# Patient Record
Sex: Male | Born: 1955 | Race: White | Hispanic: No | Marital: Married | Smoking: Current every day smoker
Health system: Southern US, Community
[De-identification: ages and names within clinical notes are randomized; demographics above are authoritative.]

## PROBLEM LIST (undated history)

## (undated) DIAGNOSIS — I10 Essential (primary) hypertension: Secondary | ICD-10-CM

## (undated) DIAGNOSIS — E785 Hyperlipidemia, unspecified: Secondary | ICD-10-CM

## (undated) DIAGNOSIS — K5790 Diverticulosis of intestine, part unspecified, without perforation or abscess without bleeding: Secondary | ICD-10-CM

## (undated) DIAGNOSIS — Z7902 Long term (current) use of antithrombotics/antiplatelets: Secondary | ICD-10-CM

## (undated) DIAGNOSIS — I739 Peripheral vascular disease, unspecified: Secondary | ICD-10-CM

## (undated) DIAGNOSIS — M1990 Unspecified osteoarthritis, unspecified site: Secondary | ICD-10-CM

## (undated) DIAGNOSIS — I700 Atherosclerosis of aorta: Secondary | ICD-10-CM

## (undated) DIAGNOSIS — I2510 Atherosclerotic heart disease of native coronary artery without angina pectoris: Secondary | ICD-10-CM

## (undated) DIAGNOSIS — Z8719 Personal history of other diseases of the digestive system: Secondary | ICD-10-CM

## (undated) HISTORY — PX: CARDIAC SURGERY: SHX584

## (undated) HISTORY — PX: HIATAL HERNIA REPAIR: SHX195

## (undated) HISTORY — PX: HERNIA REPAIR: SHX51

## (undated) HISTORY — PX: CORONARY ARTERY BYPASS GRAFT: SHX141

---

## 2007-02-16 ENCOUNTER — Emergency Department: Payer: Self-pay | Admitting: Emergency Medicine

## 2008-12-21 ENCOUNTER — Inpatient Hospital Stay: Payer: Self-pay | Admitting: Psychiatry

## 2010-07-13 ENCOUNTER — Emergency Department: Payer: Self-pay | Admitting: Emergency Medicine

## 2011-09-01 ENCOUNTER — Inpatient Hospital Stay: Payer: Self-pay | Admitting: Internal Medicine

## 2011-09-01 DIAGNOSIS — I214 Non-ST elevation (NSTEMI) myocardial infarction: Secondary | ICD-10-CM

## 2011-09-01 HISTORY — DX: Non-ST elevation (NSTEMI) myocardial infarction: I21.4

## 2011-09-04 DIAGNOSIS — I214 Non-ST elevation (NSTEMI) myocardial infarction: Secondary | ICD-10-CM

## 2011-09-04 HISTORY — DX: Non-ST elevation (NSTEMI) myocardial infarction: I21.4

## 2011-09-04 HISTORY — PX: CARDIAC CATHETERIZATION: SHX172

## 2011-09-07 DIAGNOSIS — Z951 Presence of aortocoronary bypass graft: Secondary | ICD-10-CM

## 2011-09-07 HISTORY — PX: CORONARY ARTERY BYPASS GRAFT: SHX141

## 2011-09-07 HISTORY — DX: Presence of aortocoronary bypass graft: Z95.1

## 2012-08-23 LAB — URINALYSIS, COMPLETE
Hyaline Cast: 1
Ketone: NEGATIVE
Nitrite: NEGATIVE
Ph: 5 (ref 4.5–8.0)
Protein: NEGATIVE
RBC,UR: <1 /HPF (ref 0–5)
WBC UR: 1 /HPF (ref 0–5)

## 2012-08-23 LAB — COMPREHENSIVE METABOLIC PANEL
Albumin: 3.8 g/dL (ref 3.4–5.0)
Anion Gap: 11 (ref 7–16)
BUN: 7 mg/dL (ref 7–18)
Bilirubin,Total: 0.2 mg/dL (ref 0.2–1.0)
Calcium, Total: 9.2 mg/dL (ref 8.5–10.1)
Creatinine: 1 mg/dL (ref 0.60–1.30)
Glucose: 148 mg/dL — ABNORMAL HIGH (ref 65–99)
Osmolality: 280 (ref 275–301)
Potassium: 4.3 mmol/L (ref 3.5–5.1)
SGOT(AST): 31 U/L (ref 15–37)
SGPT (ALT): 32 U/L (ref 12–78)
Total Protein: 8.2 g/dL (ref 6.4–8.2)

## 2012-08-23 LAB — CBC
MCH: 32.8 pg (ref 26.0–34.0)
MCHC: 34 g/dL (ref 32.0–36.0)
MCV: 96 fL (ref 80–100)
Platelet: 290 10*3/uL (ref 150–440)
RBC: 5.97 10*6/uL — ABNORMAL HIGH (ref 4.40–5.90)
WBC: 9.1 10*3/uL (ref 3.8–10.6)

## 2012-08-23 LAB — DRUG SCREEN, URINE
Amphetamines, Ur Screen: NEGATIVE (ref ?–1000)
Barbiturates, Ur Screen: NEGATIVE (ref ?–200)
Cocaine Metabolite,Ur ~~LOC~~: NEGATIVE (ref ?–300)
MDMA (Ecstasy)Ur Screen: NEGATIVE (ref ?–500)
Methadone, Ur Screen: NEGATIVE (ref ?–300)
Opiate, Ur Screen: NEGATIVE (ref ?–300)
Tricyclic, Ur Screen: NEGATIVE (ref ?–1000)

## 2012-08-24 ENCOUNTER — Inpatient Hospital Stay: Payer: Self-pay | Admitting: Psychiatry

## 2012-10-03 ENCOUNTER — Ambulatory Visit: Payer: Self-pay | Admitting: Unknown Physician Specialty

## 2012-10-04 ENCOUNTER — Ambulatory Visit: Payer: Self-pay | Admitting: Unknown Physician Specialty

## 2013-07-12 ENCOUNTER — Emergency Department: Payer: Self-pay | Admitting: Emergency Medicine

## 2013-07-12 LAB — DRUG SCREEN, URINE
Amphetamines, Ur Screen: NEGATIVE (ref ?–1000)
Barbiturates, Ur Screen: NEGATIVE (ref ?–200)
Methadone, Ur Screen: NEGATIVE (ref ?–300)

## 2013-07-12 LAB — CBC
HCT: 49.8 % (ref 40.0–52.0)
MCH: 31 pg (ref 26.0–34.0)
MCV: 89 fL (ref 80–100)
Platelet: 348 10*3/uL (ref 150–440)
RBC: 5.59 10*6/uL (ref 4.40–5.90)
WBC: 11 10*3/uL — ABNORMAL HIGH (ref 3.8–10.6)

## 2013-07-12 LAB — COMPREHENSIVE METABOLIC PANEL
Anion Gap: 10 (ref 7–16)
BUN: 13 mg/dL (ref 7–18)
Chloride: 105 mmol/L (ref 98–107)
Co2: 20 mmol/L — ABNORMAL LOW (ref 21–32)
EGFR (African American): >60
Osmolality: 269 (ref 275–301)
Potassium: 4 mmol/L (ref 3.5–5.1)
SGPT (ALT): 11 U/L — ABNORMAL LOW (ref 12–78)
Sodium: 135 mmol/L — ABNORMAL LOW (ref 136–145)
Total Protein: 7.6 g/dL (ref 6.4–8.2)

## 2013-07-12 LAB — ACETAMINOPHEN LEVEL: Acetaminophen: <2 ug/mL

## 2013-07-12 LAB — URINALYSIS, COMPLETE
Bacteria: NONE SEEN
Bilirubin,UR: NEGATIVE
Glucose,UR: NEGATIVE mg/dL (ref 0–75)
Hyaline Cast: 3
Ketone: NEGATIVE
Leukocyte Esterase: NEGATIVE
Ph: 5 (ref 4.5–8.0)
Specific Gravity: 1.005 (ref 1.003–1.030)
Squamous Epithelial: NONE SEEN
WBC UR: <1 /HPF (ref 0–5)

## 2013-07-12 LAB — ETHANOL
Ethanol %: <0.003 % (ref 0.000–0.080)
Ethanol: <3 mg/dL

## 2013-07-12 LAB — TROPONIN I: Troponin-I: <0.02 ng/mL
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
## 2016-01-03 ENCOUNTER — Encounter: Payer: Self-pay | Admitting: Emergency Medicine

## 2016-01-03 ENCOUNTER — Emergency Department
Admission: EM | Admit: 2016-01-03 | Discharge: 2016-01-03 | Disposition: A | Discharge status: Home / Self Care | Payer: Self-pay | Attending: Emergency Medicine | Admitting: Emergency Medicine

## 2016-01-03 DIAGNOSIS — J0100 Acute maxillary sinusitis, unspecified: Secondary | ICD-10-CM | POA: Insufficient documentation

## 2016-01-03 DIAGNOSIS — F17210 Nicotine dependence, cigarettes, uncomplicated: Secondary | ICD-10-CM | POA: Insufficient documentation

## 2016-01-03 MED ORDER — BENZONATATE 100 MG PO CAPS: 100.0000 mg | ORAL_CAPSULE | Freq: Three times a day (TID) | ORAL | Status: DC | PRN

## 2016-01-03 MED ORDER — FLUTICASONE PROPIONATE 50 MCG/ACT NA SUSP: 1.0000 | Freq: Every day | NASAL | Status: DC

## 2016-01-03 MED ORDER — AMOXICILLIN 500 MG PO CAPS: 500.0000 mg | ORAL_CAPSULE | Freq: Three times a day (TID) | ORAL | Status: DC

## 2016-01-03 MED ORDER — ONDANSETRON HCL 4 MG PO TABS: 4.0000 mg | ORAL_TABLET | Freq: Four times a day (QID) | ORAL | Status: DC | PRN

## 2016-01-03 NOTE — Discharge Instructions (Signed)

## 2016-01-03 NOTE — ED Notes (Signed)

## 2016-01-03 NOTE — ED Notes (Signed)
Pt here with c/o sinus pressure/pain since Thursday, states it makes him nauseated and dizzy at times, has been trying sudafed with no relief.

## 2016-01-03 NOTE — ED Notes (Signed)
Having sinus pressure and congestion for about 1 week  Also had some dizziness  Unsure of fever

## 2016-01-05 NOTE — ED Provider Notes (Signed)
[facility] Emergency Department Provider Note ____________________________________________  Time seen: 1956  I have reviewed the triage vital signs and the nursing notes.  HISTORY  Chief Complaint  Facial Pain; Nasal Congestion; and Nausea  HPI George Santos is a 60 y.o. male presents to the ED for evaluation of facial sinus pain and nasal congestion since Thursday. The patient also notes some blood-tinged mucus and is also noticed someassociated dizziness and nausea. He's noted some decreased appetite over the last 2 days as well as some dyspepsia. He denies any chest pain, shortness of breath, or productive cough. He does note that his cough has preceded his sinus congestion and drainage. He's been taking Sudafed, Robitussin, and Benadryl without significant benefit to his symptoms. He denies any outright fevers, chills, or sweats.He describes malaise and doses over her discomfort 6/10 in triage.  History reviewed. No pertinent past medical history.  There are no active problems to display for this patient.   Past Surgical History  Procedure Laterality Date  . Cardiac surgery    . Hernia repair      Current Outpatient Rx  Name  Route  Sig  Dispense  Refill  . amoxicillin (AMOXIL) 500 MG capsule   Oral   Take 1 capsule (500 mg total) by mouth 3 (three) times daily.   30 capsule   0   . benzonatate (TESSALON PERLES) 100 MG capsule   Oral   Take 1 capsule (100 mg total) by mouth 3 (three) times daily as needed for cough (Take 1-2 per dose).   30 capsule   0   . fluticasone (FLONASE) 50 MCG/ACT nasal spray   Each Nare   Place 1 spray into both nostrils daily.   16 g   0   . ondansetron (ZOFRAN) 4 MG tablet   Oral   Take 1 tablet (4 mg total) by mouth every 6 (six) hours as needed for nausea or vomiting.   15 tablet   0    Allergies Review of patient's allergies indicates no known allergies.  No family history on file.  Social  History Social History  Substance Use Topics  . Smoking status: Current Every Day Smoker -- 0.50 packs/day    Types: Cigarettes  . Smokeless tobacco: None  . Alcohol Use: No   Review of Systems  Constitutional: Negative for fever. Eyes: Negative for visual changes. ENT: Negative for sore throat. Sinus pressure and drainage as above. Cardiovascular: Negative for chest pain. Respiratory: Negative for shortness of breath. Nonproductive cough as noted. Gastrointestinal: Negative for abdominal pain, vomiting and diarrhea. Genitourinary: Negative for dysuria. Musculoskeletal: Negative for back pain. Skin: Negative for rash. Neurological: Negative for headaches, focal weakness or numbness. ____________________________________________  PHYSICAL EXAM:  VITAL SIGNS: ED Triage Vitals  Enc Vitals Group     BP 01/03/16 1849 129/80 mmHg     Pulse Rate 01/03/16 1849 62     Resp 01/03/16 1849 18     Temp 01/03/16 1849 99.1 F (37.3 C)     Temp Source 01/03/16 1849 Oral     SpO2 01/03/16 1849 97 %     Weight 01/03/16 1846 150 lb (68.04 kg)     Height 01/03/16 1846 5\' 9"  (1.753 m)     Head Cir --      Peak Flow --      Pain Score 01/03/16 1846 6     Pain Loc --      Pain Edu? --  Excl. in GC? --    Constitutional: Alert and oriented. Well appearing and in no distress. Head: Normocephalic and atraumatic. Frontal sinus pressure noted.       Eyes: Conjunctivae are normal. PERRL. Normal extraocular movements      Ears: Canals clear. TMs intact bilaterally.   Nose: No congestion/rhinorrhea. Erythematous nasal mucosa. Large, left nasal polyp noted.    Mouth/Throat: Mucous membranes are moist.   Neck: Supple. No thyromegaly. Hematological/Lymphatic/Immunological: No cervical lymphadenopathy. Cardiovascular: Normal rate, regular rhythm.  Respiratory: Normal respiratory effort. No wheezes/rales/rhonchi. Gastrointestinal: Soft and nontender. No distention. Musculoskeletal:  Nontender with normal range of motion in all extremities.  Neurologic:  Normal gait without ataxia. Normal speech and language. No gross focal neurologic deficits are appreciated. Skin:  Skin is warm, dry and intact. No rash noted. Psychiatric: Mood and affect are normal. Patient exhibits appropriate insight and judgment. ____________________________________________  INITIAL IMPRESSION / ASSESSMENT AND PLAN / ED COURSE  Patient with symptoms that may represent an acute sinusitis and rhinitis. He'll be discharged with prescription for amoxicillin, Flonase, Tessalon Perles, and Zofran to dose as instructed for symptom relief. He is encouraged to increase fluid intake and consider using a room humidifier for symptoms. He is to continue dosing his over-the-counter pseudoephedrine, Benadryl, and Robitussin for symptom relief. He'll follow with his primary care provider for ongoing symptom management. Options are reviewed. ____________________________________________  FINAL CLINICAL IMPRESSION(S) / ED DIAGNOSES  Final diagnoses:  Acute maxillary sinusitis, recurrence not specified      Lissa Hoard, PA-C 01/05/16 0047  Sharyn Creamer, MD 01/08/16 [phone]

## 2017-11-08 ENCOUNTER — Emergency Department
Admission: EM | Admit: 2017-11-08 | Discharge: 2017-11-08 | Disposition: A | Discharge status: Home / Self Care | Payer: Self-pay | Attending: Emergency Medicine | Admitting: Emergency Medicine

## 2017-11-08 ENCOUNTER — Other Ambulatory Visit: Payer: Self-pay

## 2017-11-08 ENCOUNTER — Encounter: Payer: Self-pay | Admitting: Emergency Medicine

## 2017-11-08 DIAGNOSIS — F17210 Nicotine dependence, cigarettes, uncomplicated: Secondary | ICD-10-CM | POA: Insufficient documentation

## 2017-11-08 DIAGNOSIS — M069 Rheumatoid arthritis, unspecified: Secondary | ICD-10-CM | POA: Insufficient documentation

## 2017-11-08 HISTORY — DX: Unspecified osteoarthritis, unspecified site: M19.90

## 2017-11-08 MED ORDER — MELOXICAM 15 MG PO TABS
15.0000 mg | ORAL_TABLET | Freq: Every day | ORAL | 2 refills | Status: DC
Start: 2017-11-08 — End: 2022-04-19

## 2017-11-08 MED ORDER — PREDNISONE 10 MG (21) PO TBPK: ORAL_TABLET | ORAL | 0 refills | Status: DC

## 2017-11-08 NOTE — ED Triage Notes (Signed)
Pt c/o left arm pain and right leg pain for 6 months, states the pain is worsening. Pt states he does a lot of walking and lifting heavy items on a daily basis.   Pt states he leg pain radiates from his hips down to his foot.  Pt ambulatory in triage without difficulty.

## 2017-11-08 NOTE — Discharge Instructions (Signed)
Follow-up with your regular doctor or Dr. Odis Luster if not better in one week, take the medication as prescribed, use a steroid and then when you are done with that medication he may use the anti-inflammatory daily, return to the emergency department appear worsening

## 2017-11-08 NOTE — ED Notes (Signed)
See triage note.having pain to right leg for several months  Ambulates well to treatment   Also having some pain to left arm  No deformity noted

## 2017-11-08 NOTE — ED Provider Notes (Signed)
[facility] Emergency Department Provider Note  ____________________________________________   First MD Initiated Contact with Patient 11/08/17 1051     (approximate)  I have reviewed the triage vital signs and the nursing notes.   HISTORY  Chief Complaint Arm Pain and Leg Pain    HPI George Santos is a 61 y.o. male complains of several joints hurting, states he has "inflammatory arthritis ", he was diagnosed over 20 years ago, he states his left shoulder and right leg has been hurting more since it got cold, he has trouble when he is walking and standing, he has been out of work for several days and will need a work note, denies chest pain or shortness of breath, is not followed regularly for his inflammatory arthritis  Past Medical History:  Diagnosis Date  . Arthritis     There are no active problems to display for this patient.   Past Surgical History:  Procedure Laterality Date  . CARDIAC SURGERY    . HERNIA REPAIR      Prior to Admission medications   Medication Sig Start Date End Date Taking? Authorizing Provider  meloxicam (MOBIC) 15 MG tablet Take 1 tablet (15 mg total) by mouth daily. 11/08/17   Faythe Ghee, PA  predniSONE (STERAPRED UNI-PAK 21 TAB) 10 MG (21) TBPK tablet Take 6 pills on day one then decrease by 1 pill each day 11/08/17   Faythe Ghee, PA    Allergies Patient has no known allergies.  History reviewed. No pertinent family history.  Social History Social History   Tobacco Use  . Smoking status: Current Every Day Smoker    Packs/day: 0.50    Types: Cigarettes  . Smokeless tobacco: Never Used  Substance Use Topics  . Alcohol use: No  . Drug use: Not on file    Review of Systems  Constitutional: No fever/chills Eyes: No visual changes. ENT: No sore throat. Respiratory: Denies cough Genitourinary: Negative for dysuria. Musculoskeletal: Negative for back pain.  Positive for left shoulder pain and right  leg pain that is Skin: Negative for rash.    ____________________________________________   PHYSICAL EXAM:  VITAL SIGNS: ED Triage Vitals  Enc Vitals Group     BP 11/08/17 1041 (!) 115/91     Pulse Rate 11/08/17 1041 66     Resp 11/08/17 1041 18     Temp 11/08/17 1041 97.7 F (36.5 C)     Temp Source 11/08/17 1041 Oral     SpO2 11/08/17 1041 99 %     Weight 11/08/17 1041 150 lb (68 kg)     Height 11/08/17 1041 5\' 9"  (1.753 m)     Head Circumference --      Peak Flow --      Pain Score 11/08/17 1040 7     Pain Loc --      Pain Edu? --      Excl. in GC? --     Constitutional: Alert and oriented. Well appearing and in no acute distress. Eyes: Conjunctivae are normal.  Head: Atraumatic. Nose: No congestion/rhinnorhea. Mouth/Throat: Mucous membranes are moist.   Cardiovascular: Normal rate, regular rhythm.  Heart sounds are normal Respiratory: Normal respiratory effort.  No retractions, lungs are clear to auscultation GU: deferred Musculoskeletal: Patient has decreased range of motion of the left shoulder with overhead reach and internal rotation, pain is reproduced with Hawks sign, there is no bony tenderness on exam, there is no redness or swelling, the right  leg is not tender, his spine is not tender, neurovascular is intact Neurologic:  Normal speech and language.  Skin:  Skin is warm, dry and intact. No rash noted. Psychiatric: Mood and affect are normal. Speech and behavior are normal.  ____________________________________________   LABS (all labs ordered are listed, but only abnormal results are displayed)  Labs Reviewed - No data to display ____________________________________________   ____________________________________________  RADIOLOGY    ____________________________________________   PROCEDURES  Procedure(s) performed: No      ____________________________________________   INITIAL IMPRESSION / ASSESSMENT AND PLAN / ED  COURSE  Pertinent labs & imaging results that were available during my care of the patient were reviewed by me and considered in my medical decision making (see chart for details).  Patient is diagnosed with rheumatoid arthritis flare, he has a long history of "inflammatory arthritis, some of the joints along the hands are swollen and enlarged, proximal joints on the fingers are swollen and enlarged, he has decreased range of motion of the left shoulder;  prescription for Sterapred DS 10 mg six-day Dosepak was given, meloxicam 15 mg daily, patient was instructed to take the anti-inflammatory medication meloxicam after he is finished with the steroid pack, he was told to follow-up with [facility] clinic as that is where he was diagnosed several years ago, he can return to the emergency department if he is worsening, he was given a work note,      ____________________________________________   FINAL CLINICAL IMPRESSION(S) / ED DIAGNOSES  Final diagnoses:  Rheumatoid arthritis flare (HCC)      NEW MEDICATIONS STARTED DURING THIS VISIT:  This SmartLink is deprecated. Use AVSMEDLIST instead to display the medication list for a patient.   Note:  This document was prepared using Dragon voice recognition software and may include unintentional dictation errors.    Faythe Ghee, PA 11/08/17 1112    Jeanmarie Plant, MD 11/08/17 1540

## 2022-01-24 ENCOUNTER — Other Ambulatory Visit: Payer: Self-pay

## 2022-01-24 ENCOUNTER — Emergency Department
Admission: EM | Admit: 2022-01-24 | Discharge: 2022-01-24 | Disposition: A | Discharge status: Home / Self Care | Payer: Medicare Other | Attending: Emergency Medicine | Admitting: Emergency Medicine

## 2022-01-24 DIAGNOSIS — Z7982 Long term (current) use of aspirin: Secondary | ICD-10-CM | POA: Insufficient documentation

## 2022-01-24 DIAGNOSIS — I739 Peripheral vascular disease, unspecified: Secondary | ICD-10-CM | POA: Insufficient documentation

## 2022-01-24 DIAGNOSIS — Z79899 Other long term (current) drug therapy: Secondary | ICD-10-CM | POA: Insufficient documentation

## 2022-01-24 DIAGNOSIS — M79671 Pain in right foot: Secondary | ICD-10-CM | POA: Diagnosis present

## 2022-01-24 DIAGNOSIS — Z951 Presence of aortocoronary bypass graft: Secondary | ICD-10-CM | POA: Insufficient documentation

## 2022-01-24 DIAGNOSIS — I10 Essential (primary) hypertension: Secondary | ICD-10-CM | POA: Diagnosis not present

## 2022-01-24 DIAGNOSIS — G8929 Other chronic pain: Secondary | ICD-10-CM | POA: Diagnosis not present

## 2022-01-24 LAB — BASIC METABOLIC PANEL
Anion gap: 8 (ref 5–15)
BUN: 7 mg/dL — ABNORMAL LOW (ref 8–23)
CO2: 25 mmol/L (ref 22–32)
Calcium: 8.5 mg/dL — ABNORMAL LOW (ref 8.9–10.3)
Chloride: 104 mmol/L (ref 98–111)
Creatinine, Ser: 0.79 mg/dL (ref 0.61–1.24)
GFR, Estimated: >60 mL/min (ref >60)
Glucose, Bld: 97 mg/dL (ref 70–99)
Potassium: 3.8 mmol/L (ref 3.5–5.1)
Sodium: 137 mmol/L (ref 135–145)

## 2022-01-24 LAB — LACTIC ACID, PLASMA
Lactic Acid, Venous: 2.3 mmol/L (ref 0.5–1.9)
Lactic Acid, Venous: 1.4 mmol/L (ref 0.5–1.9)

## 2022-01-24 LAB — CBC
HCT: 51.5 % (ref 39.0–52.0)
Hemoglobin: 16.3 g/dL (ref 13.0–17.0)
MCH: 28.5 pg (ref 26.0–34.0)
MCHC: 31.7 g/dL (ref 30.0–36.0)
MCV: 90.2 fL (ref 80.0–100.0)
Platelets: 349 10*3/uL (ref 150–400)
RBC: 5.71 MIL/uL (ref 4.22–5.81)
RDW: 13 % (ref 11.5–15.5)
WBC: 9.7 10*3/uL (ref 4.0–10.5)
nRBC: 0 % (ref 0.0–0.2)

## 2022-01-24 NOTE — ED Notes (Signed)
BP in all 4 extremities as follows:  L arm: 178/75  R arm: 212/78  LLE: 124/97  RLE: 153/93

## 2022-01-24 NOTE — Discharge Instructions (Signed)
Please call the clinic of Dr. Wyn Quaker to set up an appointment to be seen in the clinic to discuss an angiography.  This is a procedure where they squirt dye in your blood vessels of your legs to look for blockages  In the meantime, if you have any severely worsening pain, fevers or pus coming from the wounds of your feet then please return to the ED.

## 2022-01-24 NOTE — ED Provider Notes (Signed)
[facility] Provider Note    Event Date/Time   First MD Initiated Contact with Patient 01/24/22 1147     (approximate)   History   Foot Pain   HPI  [place] Santos is a 66 y.o. male who presents to the ED for evaluation of Foot Pain   No recent medical encounters to review. Patient reports a remote history of CABG, but he has not seen a cardiologist in years.  Only takes aspirin daily, no other medications. Reports a 45+ pack year smoking history.  Patient presents to the ED for evaluation of chronic bilateral foot pain.  He reports months of pain, that he thought would get better but has not so he wanted to get checked out today.  Reports a scab on the lateral aspect of his right foot at the base of his fifth phalanx.  Denies any drainage from this area, reports that it is hard and will not go away.   Physical Exam   Triage Vital Signs: ED Triage Vitals  Enc Vitals Group     BP 01/24/22 1010 (!) 205/75     Pulse Rate 01/24/22 1010 (!) 49     Resp 01/24/22 1010 18     Temp 01/24/22 1010 98 F (36.7 C)     Temp src --      SpO2 01/24/22 1010 98 %     Weight 01/24/22 1146 149 lb 14.6 oz (68 kg)     Height 01/24/22 1146 5\' 9"  (1.753 m)     Head Circumference --      Peak Flow --      Pain Score 01/24/22 1008 6     Pain Loc --      Pain Edu? --      Excl. in GC? --     Most recent vital signs: Vitals:   01/24/22 1010 01/24/22 1238  BP: (!) 205/75 (!) 178/75  Pulse: (!) 49 (!) 46  Resp: 18 20  Temp: 98 F (36.7 C)   SpO2: 98% 96%    General: Awake, no distress.  Ambulatory independently with a normal gait. CV:  Good peripheral perfusion.  Resp:  Normal effort.  Abd:  No distention.  MSK:  No deformity noted.  Neuro:  No focal deficits appreciated. Other:  Erythematous bilateral lower extremities with minimal/trace soft tissue swelling and pitting edema symmetrically.  Capillary refill is quite slow.  DP pulses stronger on the right  than the left, very faint on the left.  Cap refill is slower on the left. Pack of cigarettes in his breast pocket of his T-shirt.      ED Results / Procedures / Treatments   Labs (all labs ordered are listed, but only abnormal results are displayed) Labs Reviewed  LACTIC ACID, PLASMA - Abnormal; Notable for the following components:      Result Value   Lactic Acid, Venous 2.3 (*)    All other components within normal limits  BASIC METABOLIC PANEL - Abnormal; Notable for the following components:   BUN 7 (*)    Calcium 8.5 (*)    All other components within normal limits  LACTIC ACID, PLASMA  CBC    EKG   RADIOLOGY   Official radiology report(s): No results found.  PROCEDURES and INTERVENTIONS:  Procedures  Medications - No data to display   IMPRESSION / MDM / ASSESSMENT AND PLAN / ED COURSE  I reviewed the triage vital signs and the nursing notes.  66 year old male presents to the ED with chronic bilateral foot pain, likely PAD and suitable for outpatient management with vascular surgery follow-up.  He is hypertensive and his only complaints are bilateral foot pain.  He is ambulatory independently with a normal gait.  He has signs of PAD with poor ABIs and lifelong smoking history.  Blood work without leukocytosis to suggest superimposed cellulitis and his metabolic panel is unremarkable.  Lactic acid is initially marginally elevated, and then clears with a cup of water.  No concern for superimposed cellulitis or infection.  Less likely to represent CHF or chronic venous stasis dermatitis, more concerned about PAD.  I considered heparinization and medical admission with the patient, but he does not have any uncontrolled or rest pain at this point and I think outpatient follow-up with vascular is reasonable.  We discussed close return precautions and he is suitable for follow-up with them  Clinical Course as of 01/24/22 1319  Tue Jan 24, 2022  1311 ABI on the left is  0.70 ABI on the right is 0.72 [DS]  1316 Reassessed.  Discussed my concerns for PAD and I recommended he quit smoking.  We discussed close follow-up with vascular surgery.  We discussed appropriate return precautions for the ED in the meantime. [DS]    Clinical Course User Index [DS] Vladimir Crofts, MD     FINAL CLINICAL IMPRESSION(S) / ED DIAGNOSES   Final diagnoses:  PAD (peripheral artery disease) ([place])  Bilateral foot pain     Rx / DC Orders   ED Discharge Orders     None        Note:  This document was prepared using Dragon voice recognition software and may include unintentional dictation errors.   Vladimir Crofts, MD 01/24/22 1323

## 2022-01-24 NOTE — ED Triage Notes (Addendum)
Pt comes with c/o bilateral foot pain. Pt states this started over month ago. Pt states some sore on side of foot. No drainage just pain, redness and some swelling. Pt denies any hx of diabetes.

## 2022-01-24 NOTE — ED Notes (Signed)
First Nurse Note:  Pt to ED via POV, Pt states that he is having pain in both feet. Pt also states that he has wounds on both feet. Pt is in NAD.

## 2022-02-07 ENCOUNTER — Other Ambulatory Visit: Payer: Self-pay

## 2022-02-07 ENCOUNTER — Encounter (INDEPENDENT_AMBULATORY_CARE_PROVIDER_SITE_OTHER): Payer: Self-pay | Admitting: Vascular Surgery

## 2022-02-07 ENCOUNTER — Telehealth (INDEPENDENT_AMBULATORY_CARE_PROVIDER_SITE_OTHER): Payer: Self-pay

## 2022-02-07 ENCOUNTER — Ambulatory Visit (INDEPENDENT_AMBULATORY_CARE_PROVIDER_SITE_OTHER): Payer: Medicare Other | Admitting: Vascular Surgery

## 2022-02-07 DIAGNOSIS — I10 Essential (primary) hypertension: Secondary | ICD-10-CM | POA: Diagnosis not present

## 2022-02-07 DIAGNOSIS — I7025 Atherosclerosis of native arteries of other extremities with ulceration: Secondary | ICD-10-CM

## 2022-02-07 DIAGNOSIS — F17200 Nicotine dependence, unspecified, uncomplicated: Secondary | ICD-10-CM

## 2022-02-07 NOTE — H&P (View-Only) (Signed)
? ? ?Patient ID: [place] Santos, male   DOB: 1956/04/24, 66 y.o.   MRN: [id number] ? ?Chief Complaint  ?Patient presents with  ? Establish Care  ?  Pt referred by [facility] ED for feet  ? ? ?HPI ?[place] Santos is a 66 y.o. male.  I am asked to see the patient by Dr. Tamala Julian in the [facility] emergency department for evaluation of pain and discoloration of both feet with concern for malperfusion.  The right foot has a scab on the lateral aspect near the fifth metatarsal head.  Both feet are very painful with very short distances of walking and become quite purplish and discolored.  The pain wakes him from night.  He dangles his foot on the right and sometimes on the left to help him sleep.  No previous history of intervention or surgery to the lower extremities, but has had coronary bypass. This has been progressively worse over the past 3-4 months.  No clear cause or inciting event. ? ? ?Past Medical History:  ?Diagnosis Date  ? Arthritis   ?Coronary artery disease ?Tobacco abuse ? ?Past Surgical History:  ?Procedure Laterality Date  ? CARDIAC SURGERY    ? HERNIA REPAIR    ? ? ? ?Family History ?No bleeding disorders, clotting disorders, autoimmune diseases, or aneurysms ? ? ?Social History  ? ?Tobacco Use  ? Smoking status: Every Day  ?  Packs/day: 0.50  ?  Types: Cigarettes  ? Smokeless tobacco: Never  ?Vaping Use  ? Vaping Use: Never used  ?Substance Use Topics  ? Alcohol use: No  ? Drug use: Never  ? ? ? ?No Known Allergies ? ?Current Outpatient Medications  ?Medication Sig Dispense Refill  ? aspirin EC 81 MG tablet Take 81 mg by mouth daily. Swallow whole.    ? ibuprofen (ADVIL) 200 MG tablet Take 200 mg by mouth every 6 (six) hours as needed.    ? meloxicam (MOBIC) 15 MG tablet Take 1 tablet (15 mg total) by mouth daily. (Patient not taking: Reported on 02/07/2022) 30 tablet 2  ? ?No current facility-administered medications for this visit.  ? ? ? ? ?REVIEW OF SYSTEMS (Negative unless checked) ? ?Constitutional:  [] Weight loss  [] Fever  [] Chills ?Cardiac: [] Chest pain   [] Chest pressure   [] Palpitations   [] Shortness of breath when laying flat   [] Shortness of breath at rest   [] Shortness of breath with exertion. ?Vascular:  [x] Pain in legs with walking   [x] Pain in legs at rest   [] Pain in legs when laying flat   [] Claudication   [] Pain in feet when walking  [x] Pain in feet at rest  [] Pain in feet when laying flat   [] History of DVT   [] Phlebitis   [] Swelling in legs   [] Varicose veins   [] Non-healing ulcers ?Pulmonary:   [] Uses home oxygen   [] Productive cough   [] Hemoptysis   [] Wheeze  [] COPD   [] Asthma ?Neurologic:  [] Dizziness  [] Blackouts   [] Seizures   [] History of stroke   [] History of TIA  [] Aphasia   [] Temporary blindness   [] Dysphagia   [] Weakness or numbness in arms   [] Weakness or numbness in legs ?Musculoskeletal:  [x] Arthritis   [] Joint swelling   [] Joint pain   [] Low back pain ?Hematologic:  [] Easy bruising  [] Easy bleeding   [] Hypercoagulable state   [] Anemic  [] Hepatitis ?Gastrointestinal:  [] Blood in stool   [] Vomiting blood  [] Gastroesophageal reflux/heartburn   [] Abdominal pain ?Genitourinary:  [] Chronic kidney disease   []   Difficult urination  [] Frequent urination  [] Burning with urination   [] Hematuria ?Skin:  [] Rashes   [] Ulcers   [] Wounds ?Psychological:  [] History of anxiety   []  History of major depression. ? ? ? ?Physical Exam ?BP (!) 219/86 (BP Location: Left Arm)   Pulse (!) 50   Resp 16   Ht 5\' 9"  (1.753 m)   Wt 138 lb 9.6 oz (62.9 kg)   BMI 20.47 kg/m?  ?Gen:  WD/WN, NAD.  Appears older than stated age ?Head: [place]/AT, No temporalis wasting.  ?Ear/Nose/Throat: Hearing grossly intact, nares w/o erythema or drainage, oropharynx w/o Erythema/Exudate ?Eyes: Conjunctiva clear, sclera non-icteric  ?Neck: trachea midline.  No JVD.  ?Pulmonary:  Good air movement, respirations not labored, no use of accessory muscles  ?Cardiac: RRR, no JVD ?Vascular:  ?Vessel Right Left  ?Radial Palpable Palpable  ?     ?    ?    ?    ?    ?    ?DP NP NP  ?PT NP NP  ? ?Gastrointestinal:. No masses, surgical incisions, or scars. ?Musculoskeletal: M/S 5/5 throughout.  Feeder ruborous with somewhat delayed capillary refill.  There is a scab on the lateral aspect of the right fifth metatarsal area.  No gangrenous changes.  No drainage ?Neurologic: Sensation grossly intact in extremities.  Symmetrical.  Speech is fluent. Motor exam as listed above. ?Psychiatric: Judgment intact, Mood & affect appropriate for pt's clinical situation. ?Dermatologic: No rashes or ulcers noted.  No cellulitis or open wounds. ? ? ? ?Radiology ?No results found. ? ?Labs ?Recent Results (from the past 2160 hour(s))  ?Lactic acid, plasma     Status: Abnormal  ? Collection Time: 01/24/22 10:10 AM  ?Result Value Ref Range  ? Lactic Acid, Venous 2.3 (HH) 0.5 - 1.9 mmol/L  ?  Comment: CRITICAL RESULT CALLED TO, READ BACK BY AND VERIFIED WITH ?ANGELA ROBBINS AT 1100 01/24/22 DAS ?Performed at [facility], [address], [place], [place] [address] ?  ?CBC     Status: None  ? Collection Time: 01/24/22 10:10 AM  ?Result Value Ref Range  ? WBC 9.7 4.0 - 10.5 K/uL  ? RBC 5.71 4.22 - 5.81 MIL/uL  ? Hemoglobin 16.3 13.0 - 17.0 g/dL  ? HCT 51.5 39.0 - 52.0 %  ? MCV 90.2 80.0 - 100.0 fL  ? MCH 28.5 26.0 - 34.0 pg  ? MCHC 31.7 30.0 - 36.0 g/dL  ? RDW 13.0 11.5 - 15.5 %  ? Platelets 349 150 - 400 K/uL  ? nRBC 0.0 0.0 - 0.2 %  ?  Comment: Performed at [facility], [address] [place], [place] [address]  ?Lactic acid, plasma     Status: None  ? Collection Time: 01/24/22 12:25 PM  ?Result Value Ref Range  ? Lactic Acid, Venous 1.4 0.5 - 1.9 mmol/L  ?  Comment: Performed at [facility], [address], [place], [place] [address]  ?Basic metabolic panel     Status: Abnormal  ? Collection Time: 01/24/22 12:25 PM  ?Result Value Ref Range  ? Sodium 137 135 - 145 mmol/L  ? Potassium 3.8 3.5 - 5.1 mmol/L  ? Chloride 104 98 - 111 mmol/L  ? CO2 25 22  - 32 mmol/L  ? Glucose, Bld 97 70 - 99 mg/dL  ?  Comment: Glucose reference range applies only to samples taken after fasting for at least 8 hours.  ? BUN 7 (L) 8 - 23 mg/dL  ?  Creatinine, Ser 0.79 0.61 - 1.24 mg/dL  ? Calcium 8.5 (L) 8.9 - 10.3 mg/dL  ? GFR, Estimated >60 >60 mL/min  ?  Comment: (NOTE) ?Calculated using the CKD-EPI Creatinine Equation (2021) ?  ? Anion gap 8 5 - 15  ?  Comment: Performed at [facility], [address], [place], [place] [address]  ? ? ?Assessment/Plan: ? ?Atherosclerosis of native arteries of the extremities with ulceration ([place]) ? Recommend: ? ?The patient has evidence of severe atherosclerotic changes of both lower extremities associated with ulceration and tissue loss of the foot.  This represents a limb threatening ischemia and places the patient at the risk for limb loss. ? ?Patient should undergo angiography of the lower extremities with the hope for intervention for limb salvage.  The risks and benefits as well as the alternative therapies was discussed in detail with the patient.  All questions were answered.  Patient agrees to proceed with angiography.  We will perform the right leg first as there is a wound on the right foot and only pain on the left leg. ? ?The patient will follow up with me in the office after the procedure.  ? ? ?Tobacco use disorder ?We had a discussion for approximately 4 minutes regarding the absolute need for smoking cessation due to the deleterious nature of tobacco on the vascular system. We discussed the tobacco use would diminish patency of any intervention, and likely significantly worsen progressio of disease. We discussed multiple agents for quitting including replacement therapy or medications to reduce cravings such as Chantix. A Rx from Chantix was given today. The patient voices their understanding of the importance of smoking cessation. ? ?Essential hypertension ?Not previously diagnosed, but BP very high today. Does not have  a PCP but needs one. ? ? ? ? ? ?Leotis Pain ?02/07/2022, 10:39 AM ? ? ?This note was created with Dragon medical transcription system.  Any errors from dictation are unintentional.    ?

## 2022-02-07 NOTE — Patient Instructions (Signed)
Peripheral Vascular Disease ?Peripheral vascular disease (PVD) is a disease of the blood vessels that carry blood from the heart to the rest of the body. PVD is also called peripheral artery disease (PAD) or poor circulation. PVD affects most of the body. But it affects the legs and feet the most. ?PVD can lead to acute limb ischemia. This happens when there is a sudden stop of blood flow to an arm or leg. This is a medical emergency. ?What are the causes? ?The most common cause of PVD is a buildup of a fatty substance (plaque) inside your arteries. This decreases blood flow. Plaque can break off and block blood in a smaller artery. This can lead to acute limb ischemia. ?Other common causes of PVD include: ?Blood clots inside the blood vessels. ?Injuries to blood vessels. ?Irritation and swelling of blood vessels. ?Sudden tightening of the blood vessel (spasms). ?What increases the risk? ?A family history of PVD. ?Medical conditions, including: ?High cholesterol. ?Diabetes. ?High blood pressure. ?Heart disease. ?Past problems with blood clots. ?Past injury, such as burns or a broken bone. ?Other conditions, such as: ?Buerger's disease. This is caused by swollen or irritated blood vessels in your hands and feet. ?Arthritis. ?Birth defects that affect the arteries in your legs. ?Kidney disease. ?Using tobacco or nicotine products. ?Not getting enough exercise. ?Being very overweight (obese). ?Being 50 years old or older. ?What are the signs or symptoms? ?Cramps in your butt, legs, and feet. ?Pain and weakness in your legs when you are active that goes away when you rest. ?Leg pain when at rest. ?Leg numbness, tingling, or weakness. ?Coldness in a leg or foot, especially when compared with the other leg or foot. ?Skin or hair changes. These can include: ?Hair loss. ?Shiny skin. ?Pale or bluish skin. ?Thick toenails. ?Being unable to get or keep an erection. ?Tiredness (fatigue). ?Weak pulse or no pulse in the  feet. ?Wounds and sores on the toes, feet, or legs. These take longer to heal. ?How is this treated? ?Underlying causes are treated first. Other conditions, like diabetes, high cholesterol, and blood pressure, are also treated. Treatment may include: ?Lifestyle changes, such as: ?Quitting smoking. ?Getting regular exercise. ?Having a diet low in fat and cholesterol. ?Not drinking alcohol. ?Taking medicines, such as: ?Blood thinners. ?Medicines to improve blood flow. ?Medicines to improve your blood cholesterol. ?Procedures to: ?Open the arteries and restore blood flow. ?Insert a small mesh tube (stent) to keep a blocked vessel open. ?Create a new path for blood to flow to the body (peripheral bypass). ?Remove dead tissue from a wound. ?Remove an affected leg or arm. ?Follow these instructions at home: ?Medicines ?Take over-the-counter and prescription medicines only as told by your doctor. ?If you are taking blood thinners: ?Talk with your doctor before you take any medicines that have aspirin, or NSAIDs, such as ibuprofen. ?Take medicines exactly as told. Take them at the same time each day. ?Avoid doing things that could hurt or bruise you. Take action to prevent falls. ?Wear an alert bracelet or carry a card that shows you are taking blood thinners. ?Lifestyle ?  ?Get regular exercise. Ask your doctor about how to stay active. ?Talk with your doctor about keeping a healthy weight. If needed, ask about losing weight. ?Eat a diet that is low in fat and cholesterol. If you need help, talk with your doctor. ?Do not drink alcohol. ?Do not smoke or use any products that contain nicotine or tobacco. If you need help quitting, ask your   doctor. ?General instructions ?Take good care of your feet. To do this: ?Wear shoes that fit well and feel good. ?Check your feet often for any cuts or sores. ?Get a flu shot (influenza vaccine) each year. ?Keep all follow-up visits. ?Where to find more information ?Society for Vascular  Surgery: vascular.org ?American Heart Association: heart.org ?National Heart, Lung, and Blood Institute: nhlbi.nih.gov ?Contact a doctor if: ?You have cramps in your legs when you walk. ?You have leg pain when you rest. ?Your leg or foot feels cold. ?Your skin changes. ?You cannot get or keep an erection. ?You have cuts or sores on your legs or feet that do not heal. ?Get help right away if: ?You have sudden changes in the color and feeling of your arms or legs, such as: ?Your arm or leg turns cold, numb, and blue. ?Your arm or leg becomes red, warm, swollen, painful, or numb. ?You have any signs of a stroke. "BE FAST" is an easy way to remember the main warning signs: ?B - Balance. Dizziness, sudden trouble walking, or loss of balance. ?E - Eyes. Trouble seeing or a change in how you see. ?F - Face. Sudden weakness or loss of feeling of the face. The face or eyelid may droop on one side. ?A - Arms. Weakness or loss of feeling in an arm. This happens all of a sudden and most often on one side of the body. ?S - Speech. Sudden trouble speaking, slurred speech, or trouble understanding what people say. ?T - Time. Time to call emergency services. Write down what time symptoms started. ?You have other signs of a stroke, such as: ?A sudden, very bad headache with no known cause. ?Feeling like you may vomit (nausea). ?Vomiting. ?A seizure. ?You have chest pain or trouble breathing. ?These symptoms may be an emergency. Get help right away. Call your local emergency services (911 in the [place] ?Do not wait to see if the symptoms will go away. ?Do not drive yourself to the hospital. ?Summary ?Peripheral vascular disease (PVD) is a disease of the blood vessels. ?PVD affects the legs and feet the most. ?Symptoms may include leg pain or leg numbness, tingling, and weakness. ?Treatment may include lifestyle changes, medicines, and procedures. ?This information is not intended to replace advice given to you by your health care  provider. Make sure you discuss any questions you have with your health care provider. ?Document Revised: 05/24/2020 Document Reviewed: 05/24/2020 ?Elsevier Patient Education ? 2022 [employer] ? ?

## 2022-02-07 NOTE — Assessment & Plan Note (Signed)
Not previously diagnosed, but BP very high today. Does not have a PCP but needs one. ?

## 2022-02-07 NOTE — Progress Notes (Signed)
? ? ?Patient ID: George Santos, male   DOB: [birth year], 66 y.o.   MRN: [id number] ? ?Chief Complaint  ?Patient presents with  ? Establish Care  ?  Pt referred by [facility] ED for feet  ? ? ?HPI ?George Santos is a 66 y.o. male.  I am asked to see the patient by Dr. Tamala Julian in the [facility] emergency department for evaluation of pain and discoloration of both feet with concern for malperfusion.  The right foot has a scab on the lateral aspect near the fifth metatarsal head.  Both feet are very painful with very short distances of walking and become quite purplish and discolored.  The pain wakes him from night.  He dangles his foot on the right and sometimes on the left to help him sleep.  No previous history of intervention or surgery to the lower extremities, but has had coronary bypass. This has been progressively worse over the past 3-4 months.  No clear cause or inciting event. ? ? ?Past Medical History:  ?Diagnosis Date  ? Arthritis   ?Coronary artery disease ?Tobacco abuse ? ?Past Surgical History:  ?Procedure Laterality Date  ? CARDIAC SURGERY    ? HERNIA REPAIR    ? ? ? ?Family History ?No bleeding disorders, clotting disorders, autoimmune diseases, or aneurysms ? ? ?Social History  ? ?Tobacco Use  ? Smoking status: Every Day  ?  Packs/day: 0.50  ?  Types: Cigarettes  ? Smokeless tobacco: Never  ?Vaping Use  ? Vaping Use: Never used  ?Substance Use Topics  ? Alcohol use: No  ? Drug use: Never  ? ? ? ?No Known Allergies ? ?Current Outpatient Medications  ?Medication Sig Dispense Refill  ? aspirin EC 81 MG tablet Take 81 mg by mouth daily. Swallow whole.    ? ibuprofen (ADVIL) 200 MG tablet Take 200 mg by mouth every 6 (six) hours as needed.    ? meloxicam (MOBIC) 15 MG tablet Take 1 tablet (15 mg total) by mouth daily. (Patient not taking: Reported on 02/07/2022) 30 tablet 2  ? ?No current facility-administered medications for this visit.  ? ? ? ? ?REVIEW OF SYSTEMS (Negative unless checked) ? ?Constitutional:  [] Weight loss  [] Fever  [] Chills ?Cardiac: [] Chest pain   [] Chest pressure   [] Palpitations   [] Shortness of breath when laying flat   [] Shortness of breath at rest   [] Shortness of breath with exertion. ?Vascular:  [x] Pain in legs with walking   [x] Pain in legs at rest   [] Pain in legs when laying flat   [] Claudication   [] Pain in feet when walking  [x] Pain in feet at rest  [] Pain in feet when laying flat   [] History of DVT   [] Phlebitis   [] Swelling in legs   [] Varicose veins   [] Non-healing ulcers ?Pulmonary:   [] Uses home oxygen   [] Productive cough   [] Hemoptysis   [] Wheeze  [] COPD   [] Asthma ?Neurologic:  [] Dizziness  [] Blackouts   [] Seizures   [] History of stroke   [] History of TIA  [] Aphasia   [] Temporary blindness   [] Dysphagia   [] Weakness or numbness in arms   [] Weakness or numbness in legs ?Musculoskeletal:  [x] Arthritis   [] Joint swelling   [] Joint pain   [] Low back pain ?Hematologic:  [] Easy bruising  [] Easy bleeding   [] Hypercoagulable state   [] Anemic  [] Hepatitis ?Gastrointestinal:  [] Blood in stool   [] Vomiting blood  [] Gastroesophageal reflux/heartburn   [] Abdominal pain ?Genitourinary:  [] Chronic kidney disease   []   Difficult urination  [] Frequent urination  [] Burning with urination   [] Hematuria ?Skin:  [] Rashes   [] Ulcers   [] Wounds ?Psychological:  [] History of anxiety   []  History of major depression. ? ? ? ?Physical Exam ?BP (!) 219/86 (BP Location: Left Arm)   Pulse (!) 50   Resp 16   Ht 5\' 9"  (1.753 m)   Wt 138 lb 9.6 oz (62.9 kg)   BMI 20.47 kg/m?  ?Gen:  WD/WN, NAD.  Appears older than stated age ?Head: [place]/AT, No temporalis wasting.  ?Ear/Nose/Throat: Hearing grossly intact, nares w/o erythema or drainage, oropharynx w/o Erythema/Exudate ?Eyes: Conjunctiva clear, sclera non-icteric  ?Neck: trachea midline.  No JVD.  ?Pulmonary:  Good air movement, respirations not labored, no use of accessory muscles  ?Cardiac: RRR, no JVD ?Vascular:  ?Vessel Right Left  ?Radial Palpable Palpable  ?     ?    ?    ?    ?    ?    ?DP NP NP  ?PT NP NP  ? ?Gastrointestinal:. No masses, surgical incisions, or scars. ?Musculoskeletal: M/S 5/5 throughout.  Feeder ruborous with somewhat delayed capillary refill.  There is a scab on the lateral aspect of the right fifth metatarsal area.  No gangrenous changes.  No drainage ?Neurologic: Sensation grossly intact in extremities.  Symmetrical.  Speech is fluent. Motor exam as listed above. ?Psychiatric: Judgment intact, Mood & affect appropriate for pt's clinical situation. ?Dermatologic: No rashes or ulcers noted.  No cellulitis or open wounds. ? ? ? ?Radiology ?No results found. ? ?Labs ?Recent Results (from the past 2160 hour(s))  ?Lactic acid, plasma     Status: Abnormal  ? Collection Time: 01/24/22 10:10 AM  ?Result Value Ref Range  ? Lactic Acid, Venous 2.3 (HH) 0.5 - 1.9 mmol/L  ?  Comment: CRITICAL RESULT CALLED TO, READ BACK BY AND VERIFIED WITH ?ANGELA ROBBINS AT 1100 01/24/22 DAS ?Performed at [facility], [address], [place], [place] [address] ?  ?CBC     Status: None  ? Collection Time: 01/24/22 10:10 AM  ?Result Value Ref Range  ? WBC 9.7 4.0 - 10.5 K/uL  ? RBC 5.71 4.22 - 5.81 MIL/uL  ? Hemoglobin 16.3 13.0 - 17.0 g/dL  ? HCT 51.5 39.0 - 52.0 %  ? MCV 90.2 80.0 - 100.0 fL  ? MCH 28.5 26.0 - 34.0 pg  ? MCHC 31.7 30.0 - 36.0 g/dL  ? RDW 13.0 11.5 - 15.5 %  ? Platelets 349 150 - 400 K/uL  ? nRBC 0.0 0.0 - 0.2 %  ?  Comment: Performed at [facility], [address], [place], [place] [address]  ?Lactic acid, plasma     Status: None  ? Collection Time: 01/24/22 12:25 PM  ?Result Value Ref Range  ? Lactic Acid, Venous 1.4 0.5 - 1.9 mmol/L  ?  Comment: Performed at [facility], [address], [place], [place] [address]  ?Basic metabolic panel     Status: Abnormal  ? Collection Time: 01/24/22 12:25 PM  ?Result Value Ref Range  ? Sodium 137 135 - 145 mmol/L  ? Potassium 3.8 3.5 - 5.1 mmol/L  ? Chloride 104 98 - 111 mmol/L  ? CO2 25 22  - 32 mmol/L  ? Glucose, Bld 97 70 - 99 mg/dL  ?  Comment: Glucose reference range applies only to samples taken after fasting for at least 8 hours.  ? BUN 7 (L) 8 - 23 mg/dL  ?  Creatinine, Ser 0.79 0.61 - 1.24 mg/dL  ? Calcium 8.5 (L) 8.9 - 10.3 mg/dL  ? GFR, Estimated >60 >60 mL/min  ?  Comment: (NOTE) ?Calculated using the CKD-EPI Creatinine Equation (2021) ?  ? Anion gap 8 5 - 15  ?  Comment: Performed at [facility], [address] [place], [place] [address]  ? ? ?Assessment/Plan: ? ?Atherosclerosis of native arteries of the extremities with ulceration ([place]) ? Recommend: ? ?The patient has evidence of severe atherosclerotic changes of both lower extremities associated with ulceration and tissue loss of the foot.  This represents a limb threatening ischemia and places the patient at the risk for limb loss. ? ?Patient should undergo angiography of the lower extremities with the hope for intervention for limb salvage.  The risks and benefits as well as the alternative therapies was discussed in detail with the patient.  All questions were answered.  Patient agrees to proceed with angiography.  We will perform the right leg first as there is a wound on the right foot and only pain on the left leg. ? ?The patient will follow up with me in the office after the procedure.  ? ? ?Tobacco use disorder ?We had a discussion for approximately 4 minutes regarding the absolute need for smoking cessation due to the deleterious nature of tobacco on the vascular system. We discussed the tobacco use would diminish patency of any intervention, and likely significantly worsen progressio of disease. We discussed multiple agents for quitting including replacement therapy or medications to reduce cravings such as Chantix. A Rx from Chantix was given today. The patient voices their understanding of the importance of smoking cessation. ? ?Essential hypertension ?Not previously diagnosed, but BP very high today. Does not have  a PCP but needs one. ? ? ? ? ? ?Leotis Pain ?02/07/2022, 10:39 AM ? ? ?This note was created with Dragon medical transcription system.  Any errors from dictation are unintentional.    ?

## 2022-02-07 NOTE — Assessment & Plan Note (Signed)
Recommend: ? ?The patient has evidence of severe atherosclerotic changes of both lower extremities associated with ulceration and tissue loss of the foot.  This represents a limb threatening ischemia and places the patient at the risk for limb loss. ? ?Patient should undergo angiography of the lower extremities with the hope for intervention for limb salvage.  The risks and benefits as well as the alternative therapies was discussed in detail with the patient.  All questions were answered.  Patient agrees to proceed with angiography.  We will perform the right leg first as there is a wound on the right foot and only pain on the left leg. ? ?The patient will follow up with me in the office after the procedure.  ? ?

## 2022-02-07 NOTE — Assessment & Plan Note (Addendum)
We had a discussion for approximately 4 minutes regarding the absolute need for smoking cessation due to the deleterious nature of tobacco on the vascular system. We discussed the tobacco use would diminish patency of any intervention, and likely significantly worsen progressio of disease. We discussed multiple agents for quitting including replacement therapy or medications to reduce cravings such as Chantix. A Rx from Chantix was given today. The patient voices their understanding of the importance of smoking cessation. ?

## 2022-02-07 NOTE — Telephone Encounter (Signed)
Patient was seen today, I attempted to contact to schedule him for a RLE and LLE angio with Dr. Wyn Quaker. Called both numbers associated with him and unable to leave a message for a return call. ?

## 2022-02-08 NOTE — Telephone Encounter (Signed)
I attempted to make contact with the patient and was unable to leave a message. ?

## 2022-02-13 ENCOUNTER — Telehealth (INDEPENDENT_AMBULATORY_CARE_PROVIDER_SITE_OTHER): Payer: Self-pay

## 2022-02-13 NOTE — Telephone Encounter (Addendum)
I attempted to contact the patient to schedule him for a RLE angio with Dr. Wyn Quaker. I have been unable to make contact through his phone numbers. I called his spouse and asked her to have him call. Me. Patient's spouse returned the call and the patient is scheduled with Dr. Wyn Quaker for a RLE angio -02/20/22- 9:00 am arrival time and LLE angio - 02/27/22- 8:15 am arrival time to the MM. Pre-procedure instructions were discussed and will be mailed. ?

## 2022-02-20 ENCOUNTER — Encounter
Admission: RE | Disposition: A | Discharge status: Home / Self Care | Payer: Self-pay | Source: Home / Self Care | Attending: Vascular Surgery

## 2022-02-20 ENCOUNTER — Ambulatory Visit
Admission: RE | Admit: 2022-02-20 | Discharge: 2022-02-20 | Disposition: A | Discharge status: Home / Self Care | Payer: Medicare Other | Attending: Vascular Surgery | Admitting: Vascular Surgery

## 2022-02-20 ENCOUNTER — Encounter: Payer: Self-pay | Admitting: Vascular Surgery

## 2022-02-20 ENCOUNTER — Other Ambulatory Visit (INDEPENDENT_AMBULATORY_CARE_PROVIDER_SITE_OTHER): Payer: Self-pay | Admitting: Nurse Practitioner

## 2022-02-20 ENCOUNTER — Other Ambulatory Visit: Payer: Self-pay

## 2022-02-20 DIAGNOSIS — I70211 Atherosclerosis of native arteries of extremities with intermittent claudication, right leg: Secondary | ICD-10-CM | POA: Diagnosis not present

## 2022-02-20 DIAGNOSIS — I70299 Other atherosclerosis of native arteries of extremities, unspecified extremity: Secondary | ICD-10-CM

## 2022-02-20 DIAGNOSIS — L97819 Non-pressure chronic ulcer of other part of right lower leg with unspecified severity: Secondary | ICD-10-CM

## 2022-02-20 DIAGNOSIS — I7025 Atherosclerosis of native arteries of other extremities with ulceration: Secondary | ICD-10-CM

## 2022-02-20 DIAGNOSIS — F17210 Nicotine dependence, cigarettes, uncomplicated: Secondary | ICD-10-CM | POA: Diagnosis not present

## 2022-02-20 DIAGNOSIS — I2510 Atherosclerotic heart disease of native coronary artery without angina pectoris: Secondary | ICD-10-CM | POA: Insufficient documentation

## 2022-02-20 DIAGNOSIS — L97909 Non-pressure chronic ulcer of unspecified part of unspecified lower leg with unspecified severity: Secondary | ICD-10-CM

## 2022-02-20 DIAGNOSIS — I70212 Atherosclerosis of native arteries of extremities with intermittent claudication, left leg: Secondary | ICD-10-CM | POA: Diagnosis not present

## 2022-02-20 DIAGNOSIS — I70235 Atherosclerosis of native arteries of right leg with ulceration of other part of foot: Secondary | ICD-10-CM | POA: Insufficient documentation

## 2022-02-20 DIAGNOSIS — I10 Essential (primary) hypertension: Secondary | ICD-10-CM | POA: Insufficient documentation

## 2022-02-20 DIAGNOSIS — L97519 Non-pressure chronic ulcer of other part of right foot with unspecified severity: Secondary | ICD-10-CM | POA: Diagnosis not present

## 2022-02-20 DIAGNOSIS — I743 Embolism and thrombosis of arteries of the lower extremities: Secondary | ICD-10-CM

## 2022-02-20 DIAGNOSIS — I700 Atherosclerosis of aorta: Secondary | ICD-10-CM | POA: Diagnosis not present

## 2022-02-20 DIAGNOSIS — Z7982 Long term (current) use of aspirin: Secondary | ICD-10-CM | POA: Insufficient documentation

## 2022-02-20 DIAGNOSIS — I70238 Atherosclerosis of native arteries of right leg with ulceration of other part of lower right leg: Secondary | ICD-10-CM

## 2022-02-20 HISTORY — DX: Peripheral vascular disease, unspecified: I73.9

## 2022-02-20 HISTORY — PX: LOWER EXTREMITY ANGIOGRAPHY: CATH118251

## 2022-02-20 HISTORY — DX: Atherosclerotic heart disease of native coronary artery without angina pectoris: I25.10

## 2022-02-20 LAB — CREATININE, SERUM
Creatinine, Ser: 0.98 mg/dL (ref 0.61–1.24)
GFR, Estimated: >60 mL/min (ref >60)

## 2022-02-20 LAB — BUN: BUN: 10 mg/dL (ref 8–23)

## 2022-02-20 SURGERY — LOWER EXTREMITY ANGIOGRAPHY
Anesthesia: Moderate Sedation | Site: Leg Lower | Laterality: Right

## 2022-02-20 MED ORDER — ONDANSETRON HCL 4 MG/2ML IJ SOLN: 4.0000 mg | Freq: Four times a day (QID) | INTRAMUSCULAR | Status: DC | PRN

## 2022-02-20 MED ORDER — MIDAZOLAM HCL 2 MG/2ML IJ SOLN
INTRAMUSCULAR | Status: DC | PRN
  Administered 2022-02-20: 1 mg via INTRAVENOUS
  Administered 2022-02-20: 2 mg via INTRAVENOUS
  Administered 2022-02-20: 1 mg via INTRAVENOUS

## 2022-02-20 MED ORDER — FENTANYL CITRATE PF 50 MCG/ML IJ SOSY
PREFILLED_SYRINGE | INTRAMUSCULAR | Status: AC
  Filled 2022-02-20: qty 2

## 2022-02-20 MED ORDER — METHYLPREDNISOLONE SODIUM SUCC 125 MG IJ SOLR: 125.0000 mg | Freq: Once | INTRAMUSCULAR | Status: DC | PRN

## 2022-02-20 MED ORDER — FENTANYL CITRATE (PF) 100 MCG/2ML IJ SOLN
INTRAMUSCULAR | Status: DC | PRN
  Administered 2022-02-20 (×2): 25 ug via INTRAVENOUS
  Administered 2022-02-20: 50 ug via INTRAVENOUS

## 2022-02-20 MED ORDER — SODIUM CHLORIDE 0.9 % IV SOLN: 250.0000 mL | INTRAVENOUS | Status: DC | PRN

## 2022-02-20 MED ORDER — HEPARIN SODIUM (PORCINE) 1000 UNIT/ML IJ SOLN
INTRAMUSCULAR | Status: AC
  Filled 2022-02-20: qty 10

## 2022-02-20 MED ORDER — CLOPIDOGREL BISULFATE 75 MG PO TABS: 75.0000 mg | ORAL_TABLET | Freq: Every day | ORAL | 11 refills | Status: DC

## 2022-02-20 MED ORDER — CEFAZOLIN SODIUM-DEXTROSE 2-4 GM/100ML-% IV SOLN: 2.0000 g | Freq: Once | INTRAVENOUS | Status: AC

## 2022-02-20 MED ORDER — SODIUM CHLORIDE 0.9 % IV SOLN: INTRAVENOUS | Status: DC

## 2022-02-20 MED ORDER — ATORVASTATIN CALCIUM 10 MG PO TABS: 10.0000 mg | ORAL_TABLET | Freq: Every day | ORAL | 11 refills | Status: DC

## 2022-02-20 MED ORDER — FAMOTIDINE 20 MG PO TABS: 40.0000 mg | ORAL_TABLET | Freq: Once | ORAL | Status: DC | PRN

## 2022-02-20 MED ORDER — HYDROMORPHONE HCL 1 MG/ML IJ SOLN: 1.0000 mg | Freq: Once | INTRAMUSCULAR | Status: DC | PRN

## 2022-02-20 MED ORDER — CEFAZOLIN SODIUM-DEXTROSE 2-4 GM/100ML-% IV SOLN
INTRAVENOUS | Status: AC
  Administered 2022-02-20: 2 g via INTRAVENOUS
  Filled 2022-02-20: qty 100

## 2022-02-20 MED ORDER — SODIUM CHLORIDE 0.9% FLUSH: 3.0000 mL | Freq: Two times a day (BID) | INTRAVENOUS | Status: DC

## 2022-02-20 MED ORDER — LABETALOL HCL 5 MG/ML IV SOLN: 10.0000 mg | INTRAVENOUS | Status: DC | PRN

## 2022-02-20 MED ORDER — CLOPIDOGREL BISULFATE 75 MG PO TABS: 75.0000 mg | ORAL_TABLET | Freq: Every day | ORAL | Status: DC

## 2022-02-20 MED ORDER — FENTANYL CITRATE PF 50 MCG/ML IJ SOSY
PREFILLED_SYRINGE | INTRAMUSCULAR | Status: AC
  Filled 2022-02-20: qty 1

## 2022-02-20 MED ORDER — HEPARIN SODIUM (PORCINE) 1000 UNIT/ML IJ SOLN
INTRAMUSCULAR | Status: DC | PRN
  Administered 2022-02-20: 5000 [IU] via INTRAVENOUS

## 2022-02-20 MED ORDER — SODIUM CHLORIDE 0.9% FLUSH: 3.0000 mL | INTRAVENOUS | Status: DC | PRN

## 2022-02-20 MED ORDER — MIDAZOLAM HCL 2 MG/2ML IJ SOLN
INTRAMUSCULAR | Status: AC
  Filled 2022-02-20: qty 4

## 2022-02-20 MED ORDER — MIDAZOLAM HCL 2 MG/ML PO SYRP: 8.0000 mg | ORAL_SOLUTION | Freq: Once | ORAL | Status: DC | PRN

## 2022-02-20 MED ORDER — ATORVASTATIN CALCIUM 10 MG PO TABS
10.0000 mg | ORAL_TABLET | Freq: Every day | ORAL | Status: DC
  Filled 2022-02-20: qty 1

## 2022-02-20 MED ORDER — IODIXANOL 320 MG/ML IV SOLN
INTRAVENOUS | Status: DC | PRN
  Administered 2022-02-20: 55 mL via INTRA_ARTERIAL

## 2022-02-20 MED ORDER — ACETAMINOPHEN 325 MG PO TABS: 650.0000 mg | ORAL_TABLET | ORAL | Status: DC | PRN

## 2022-02-20 MED ORDER — HYDRALAZINE HCL 20 MG/ML IJ SOLN: 5.0000 mg | INTRAMUSCULAR | Status: DC | PRN

## 2022-02-20 MED ORDER — MIDAZOLAM HCL 2 MG/2ML IJ SOLN
INTRAMUSCULAR | Status: AC
  Filled 2022-02-20: qty 2

## 2022-02-20 MED ORDER — DIPHENHYDRAMINE HCL 50 MG/ML IJ SOLN: 50.0000 mg | Freq: Once | INTRAMUSCULAR | Status: DC | PRN

## 2022-02-20 SURGICAL SUPPLY — 16 items
BALLN LUTONIX 018 5X220X130 (BALLOONS) ×2 IMPLANT
BALLOON LUTONIX 018 5X220X130 (BALLOONS) IMPLANT
CATH ANGIO 5F PIGTAIL 65CM (CATHETERS) ×1 IMPLANT
CATH ROTAREX 135 6FR (CATHETERS) ×1 IMPLANT
CATH VERT 5X100 (CATHETERS) ×1 IMPLANT
COVER PROBE U/S 5X48 (MISCELLANEOUS) ×1 IMPLANT
DEVICE STARCLOSE SE CLOSURE (Vascular Products) ×1 IMPLANT
GLIDEWIRE ADV .035X260CM (WIRE) ×1 IMPLANT
KIT ENCORE 26 ADVANTAGE (KITS) ×1 IMPLANT
PACK ANGIOGRAPHY (CUSTOM PROCEDURE TRAY) ×2 IMPLANT
SHEATH ANL2 6FRX45 HC (SHEATH) ×1 IMPLANT
SHEATH BRITE TIP 5FRX11 (SHEATH) ×1 IMPLANT
SYR MEDRAD MARK 7 150ML (SYRINGE) ×1 IMPLANT
TUBING CONTRAST HIGH PRESS 72 (TUBING) ×1 IMPLANT
WIRE G V18X300CM (WIRE) ×1 IMPLANT
WIRE GUIDERIGHT .035X150 (WIRE) ×1 IMPLANT

## 2022-02-20 NOTE — Op Note (Signed)
[facility] ? Percutaneous Study/Intervention Procedural Note ? ? ?Date of Surgery: 02/20/2022 ? ?Surgeon(s):Charly Holcomb   ? ?Assistants:none ? ?Pre-operative Diagnosis: PAD with severe claudication, right greater than left ? ?Post-operative diagnosis:  Same ? ?Procedure(s) Performed: ?            1.  Ultrasound guidance for vascular access left femoral artery ?            2.  Catheter placement into right common femoral artery from left femoral approach ?            3.  Aortogram and selective right lower extremity angiogram ?            4.  Mechanical thrombectomy of right SFA with the [place] Rex device ?            5.  Percutaneous transluminal angioplasty of right SFA with 5 mm diameter by 22 cm length Lutonix drug-coated angioplasty balloon ? 6.  StarClose closure device left femoral artery ? ?EBL: 50 cc ? ?Contrast: 55 cc ? ?Fluoro Time: 3.4 minutes ? ?Moderate Conscious Sedation Time: approximately 53 minutes using 4 mg of Versed and 100 mcg of Fentanyl ?             ?Indications:  Patient is a 66 y.o.male with disabling claudication symptoms of both lower extremities and some signs of early rest pain particular on the right. The patient is brought in for angiography for further evaluation and potential treatment.  Due to the limb threatening nature of the situation, angiogram was performed for attempted limb salvage. The patient is aware that if the procedure fails, amputation would be expected.  The patient also understands that even with successful revascularization, amputation may still be required due to the severity of the situation.  Risks and benefits are discussed and informed consent is obtained.  ? ?Procedure:  The patient was identified and appropriate procedural time out was performed.  The patient was then placed supine on the table and prepped and draped in the usual sterile fashion. Moderate conscious sedation was administered during a face to face encounter with the  patient throughout the procedure with my supervision of the RN administering medicines and monitoring the patient's vital signs, pulse oximetry, telemetry and mental status throughout from the start of the procedure until the patient was taken to the recovery room. Ultrasound was used to evaluate the left common femoral artery.  It was patent .  A digital ultrasound image was acquired.  A Seldinger needle was used to access the left common femoral artery under direct ultrasound guidance and a permanent image was performed.  A 0.035 J wire was advanced without resistance and a 5Fr sheath was placed.  Pigtail catheter was placed into the aorta and an AP aortogram was performed. This demonstrated normal renal arteries but the aorta and iliac arteries were heavily diseased.  There was a significant irregularity in the mid to distal aorta associated with some ectasia proximally and distally that was creating at least a borderline significant stenosis.  The right common iliac arteries had 60 to 70% stenosis in the left common iliac arteries had 75 to 80% stenosis.  The external iliac arteries were fairly normal bilaterally. I then crossed the aortic bifurcation and advanced to the right femoral head. Selective right  lower extremity angiogram was then performed. This demonstrated that the common femoral artery and profunda femoris artery were patent without significant disease.  With proximal SFA about 8 to  10 cm beyond the origin had approximately 50% stenosis.  There was then a near occlusive stenosis or short segment occlusion in the mid SFA and another area in the distal SFA with about an 80 to 90% stenosis.  The popliteal artery normalized and there was three-vessel runoff distally although the flow was extremely sluggish. It was felt that it was in the patient's best interest to proceed with intervention on the infrainguinal disease on the right after these images to avoid a second procedure and a larger amount of  contrast and fluoroscopy based off of the findings from the initial angiogram.  The aortoiliac disease will likely best be managed with an Endologix stent graft, but we would generally do that with anesthesia and a larger sheath and that would be done another day.  The patient was systemically heparinized and a 6 [place] Ansell sheath was then placed over the [employer] wire. I then used a Kumpe catheter and the advantage wire to navigate through the SFA lesions without difficulty and then exchanged for a V18 wire which was parked in the peroneal artery.  I then perform mechanical thrombectomy with 2 passes with the [place] Rex device in the right SFA to encompass all 3 lesions.  Following this, there was debulking of the chronic thrombus although all 3 lesions still remained.  I treated these areas with a 5 mm diameter by 22 cm length Lutonix drug-coated angioplasty balloon inflated to 10 atm for 1 minute.  Completion imaging showed marked improvement with only about a 20% residual stenosis proximally, a 15 to 20% residual stenosis in the midsegment, and a 20 to 30% residual stenosis in the distal SFA. I elected to terminate the procedure. The sheath was removed and StarClose closure device was deployed in the left femoral artery with excellent hemostatic result. The patient was taken to the recovery room in stable condition having tolerated the procedure well. ? ?Findings:   ?            Aortogram:  This demonstrated normal renal arteries but the aorta and iliac arteries were heavily diseased.  There was a significant irregularity in the mid to distal aorta associated with some ectasia proximally and distally that was creating at least a borderline significant stenosis.  The right common iliac arteries had 60 to 70% stenosis in the left common iliac arteries had 75 to 80% stenosis.  The external iliac arteries were fairly normal bilaterally. ?            Right Lower Extremity: The common femoral artery and  profunda femoris artery were patent without significant disease.  With proximal SFA about 8 to 10 cm beyond the origin had approximately 50% stenosis.  There was then a near occlusive stenosis or short segment occlusion in the mid SFA and another area in the distal SFA with about an 80 to 90% stenosis.  The popliteal artery normalized and there was three-vessel runoff distally although the flow was extremely sluggish. ? ? ?Disposition: Patient was taken to the recovery room in stable condition having tolerated the procedure well. ? ?Complications: None ? ?Leotis Pain ?02/20/2022 ?11:37 AM ? ? ?This note was created with Dragon Medical transcription system. Any errors in dictation are purely unintentional.  ?

## 2022-02-20 NOTE — Interval H&P Note (Signed)
History and Physical Interval Note: ? ?02/20/2022 ?8:55 AM ? ?George Santos  has presented today for surgery, with the diagnosis of RLE Angio  BARD   ASO w ulceration.  The various methods of treatment have been discussed with the patient and family. After consideration of risks, benefits and other options for treatment, the patient has consented to  Procedure(s): ?Lower Extremity Angiography (Right) as a surgical intervention.  The patient's history has been reviewed, patient examined, no change in status, stable for surgery.  I have reviewed the patient's chart and labs.  Questions were answered to the patient's satisfaction.   ? ? ?Festus Barren ? ? ?

## 2022-02-21 ENCOUNTER — Telehealth (INDEPENDENT_AMBULATORY_CARE_PROVIDER_SITE_OTHER): Payer: Self-pay

## 2022-02-21 ENCOUNTER — Encounter: Payer: Self-pay | Admitting: Vascular Surgery

## 2022-02-21 ENCOUNTER — Telehealth (INDEPENDENT_AMBULATORY_CARE_PROVIDER_SITE_OTHER): Payer: Self-pay | Admitting: Vascular Surgery

## 2022-02-21 NOTE — Telephone Encounter (Signed)
I have attempted to contact the patient on multiple phone lines including his sister's number as well and messages cannot be left as the phone only rings. Patient is in need of cardiac clearance to be scheduled for a AAA stent graft repair. ?

## 2022-02-21 NOTE — Telephone Encounter (Signed)
ATC pt to schedule post up : Follow up with Annice Needy, MD (Vascular Surgery) in 2 weeks (03/06/2022); with ABIs. ?ATC both numbers listed for pt - neither number worked. Unable to LVM.  ?

## 2022-02-27 ENCOUNTER — Ambulatory Visit: Admit: 2022-02-27 | Payer: Medicare Other | Admitting: Vascular Surgery

## 2022-02-27 DIAGNOSIS — L97909 Non-pressure chronic ulcer of unspecified part of unspecified lower leg with unspecified severity: Secondary | ICD-10-CM

## 2022-02-27 DIAGNOSIS — I70299 Other atherosclerosis of native arteries of extremities, unspecified extremity: Secondary | ICD-10-CM

## 2022-02-27 SURGERY — LOWER EXTREMITY ANGIOGRAPHY
Anesthesia: Moderate Sedation | Site: Leg Lower | Laterality: Left

## 2022-03-02 ENCOUNTER — Telehealth (INDEPENDENT_AMBULATORY_CARE_PROVIDER_SITE_OTHER): Payer: Self-pay

## 2022-03-02 NOTE — Telephone Encounter (Signed)
I attempted to contact the patient to schedule a endologic stent graft and was unable to leave a message on either phone line. ?

## 2022-04-04 ENCOUNTER — Other Ambulatory Visit (INDEPENDENT_AMBULATORY_CARE_PROVIDER_SITE_OTHER): Payer: Self-pay | Admitting: Vascular Surgery

## 2022-04-04 DIAGNOSIS — I70211 Atherosclerosis of native arteries of extremities with intermittent claudication, right leg: Secondary | ICD-10-CM

## 2022-04-04 DIAGNOSIS — Z9862 Peripheral vascular angioplasty status: Secondary | ICD-10-CM

## 2022-04-05 ENCOUNTER — Telehealth (INDEPENDENT_AMBULATORY_CARE_PROVIDER_SITE_OTHER): Payer: Self-pay

## 2022-04-05 ENCOUNTER — Ambulatory Visit (INDEPENDENT_AMBULATORY_CARE_PROVIDER_SITE_OTHER): Payer: Medicare Other | Admitting: Nurse Practitioner

## 2022-04-05 ENCOUNTER — Ambulatory Visit (INDEPENDENT_AMBULATORY_CARE_PROVIDER_SITE_OTHER): Payer: Medicare Other

## 2022-04-05 ENCOUNTER — Encounter (INDEPENDENT_AMBULATORY_CARE_PROVIDER_SITE_OTHER): Payer: Self-pay | Admitting: Nurse Practitioner

## 2022-04-05 VITALS — BP 174/75 | HR 56 | Resp 17 | Ht 69.0 in | Wt 140.0 lb

## 2022-04-05 DIAGNOSIS — I10 Essential (primary) hypertension: Secondary | ICD-10-CM | POA: Diagnosis not present

## 2022-04-05 DIAGNOSIS — I70211 Atherosclerosis of native arteries of extremities with intermittent claudication, right leg: Secondary | ICD-10-CM | POA: Diagnosis not present

## 2022-04-05 DIAGNOSIS — F17200 Nicotine dependence, unspecified, uncomplicated: Secondary | ICD-10-CM

## 2022-04-05 DIAGNOSIS — Z9862 Peripheral vascular angioplasty status: Secondary | ICD-10-CM

## 2022-04-05 NOTE — Telephone Encounter (Signed)
Spoke with the patient and gave him the information regarding his surgery with Dr. Lucky Cowboy on 04/26/22 for a Endologic stent graft. Pre-surgical instructions were discussed and  handed to patient. The anesthesia and OR staff was scheduled through [facility]. Pre-op phone call is on 04/19/22 between 8-1 pm. ?

## 2022-04-06 ENCOUNTER — Other Ambulatory Visit: Payer: Self-pay

## 2022-04-06 ENCOUNTER — Emergency Department
Admission: EM | Admit: 2022-04-06 | Discharge: 2022-04-06 | Disposition: A | Discharge status: Home / Self Care | Payer: Medicare Other | Attending: Emergency Medicine | Admitting: Emergency Medicine

## 2022-04-06 ENCOUNTER — Emergency Department: Payer: Medicare Other

## 2022-04-06 DIAGNOSIS — K625 Hemorrhage of anus and rectum: Secondary | ICD-10-CM | POA: Insufficient documentation

## 2022-04-06 DIAGNOSIS — I10 Essential (primary) hypertension: Secondary | ICD-10-CM | POA: Insufficient documentation

## 2022-04-06 LAB — COMPREHENSIVE METABOLIC PANEL
ALT: 12 U/L (ref 0–44)
AST: 17 U/L (ref 15–41)
Albumin: 4 g/dL (ref 3.5–5.0)
Alkaline Phosphatase: 84 U/L (ref 38–126)
Anion gap: 10 (ref 5–15)
BUN: 11 mg/dL (ref 8–23)
CO2: 26 mmol/L (ref 22–32)
Calcium: 8.6 mg/dL — ABNORMAL LOW (ref 8.9–10.3)
Chloride: 102 mmol/L (ref 98–111)
Creatinine, Ser: 0.97 mg/dL (ref 0.61–1.24)
GFR, Estimated: >60 mL/min (ref >60)
Glucose, Bld: 157 mg/dL — ABNORMAL HIGH (ref 70–99)
Potassium: 4 mmol/L (ref 3.5–5.1)
Sodium: 138 mmol/L (ref 135–145)
Total Bilirubin: 0.7 mg/dL (ref 0.3–1.2)
Total Protein: 7.8 g/dL (ref 6.5–8.1)

## 2022-04-06 LAB — LACTIC ACID, PLASMA: Lactic Acid, Venous: 1.6 mmol/L (ref 0.5–1.9)

## 2022-04-06 LAB — CBC
HCT: 46.4 % (ref 39.0–52.0)
Hemoglobin: 14.9 g/dL (ref 13.0–17.0)
MCH: 29 pg (ref 26.0–34.0)
MCHC: 32.1 g/dL (ref 30.0–36.0)
MCV: 90.4 fL (ref 80.0–100.0)
Platelets: 498 10*3/uL — ABNORMAL HIGH (ref 150–400)
RBC: 5.13 MIL/uL (ref 4.22–5.81)
RDW: 13 % (ref 11.5–15.5)
WBC: 12.4 10*3/uL — ABNORMAL HIGH (ref 4.0–10.5)
nRBC: 0 % (ref 0.0–0.2)

## 2022-04-06 LAB — PROTIME-INR
INR: 1 (ref 0.8–1.2)
Prothrombin Time: 13.3 seconds (ref 11.4–15.2)

## 2022-04-06 LAB — TYPE AND SCREEN
ABO/RH(D): A POS
Antibody Screen: NEGATIVE

## 2022-04-06 MED ORDER — IOHEXOL 350 MG/ML SOLN
100.0000 mL | Freq: Once | INTRAVENOUS | Status: AC | PRN
  Administered 2022-04-06: 100 mL via INTRAVENOUS
  Filled 2022-04-06: qty 100

## 2022-04-06 NOTE — ED Triage Notes (Signed)
Pt comes into the ED via EMS from home with c/o loose red bloody stools with clots today , pt takes plavix, denies any pain or nausea ?

## 2022-04-06 NOTE — Discharge Instructions (Addendum)
It is very important that you call both the GI doctor to arrange for a colonoscopy as well as establish care with a PCP.  Please return the emergency department immediately if you develop dizziness/lightheadedness, abdominal pain, chest pain, trouble breathing, or any other concerns.  It was a pleasure caring for you today. ?

## 2022-04-06 NOTE — ED Provider Notes (Signed)
? ?[facility] ?Provider Note ? ? ? Event Date/Time  ? First MD Initiated Contact with Patient 04/06/22 1515   ?  (approximate) ? ? ?History  ? ?GI Bleeding ? ? ?HPI ? ?George Santos is a 66 y.o. male  with a PMH of PAD in bilateral lower extremities in extremities who presents today with BRBPR since 3am this morning. Patient reports that he was awake and went to move his bowels but noticed that it was frank blood with clots. He reports that he has noticed both bright red blood and black spots in his bowel movements. He has not had any stool. No abdominal pain. No chest pain. No dizziness/lightheadedness. No fever. Had one colonoscopy >10 years ago at OSH, reports that it was normal. He is scheduled for endologic stent graft  on 04/26/22 with Dr. Wyn Quakerew. 45+ pack year smoking history. ? ?Patient Active Problem List  ? Diagnosis Date Noted  ? Atherosclerosis of native arteries of the extremities with ulceration (HCC) 02/07/2022  ? Tobacco use disorder 02/07/2022  ? Essential hypertension 02/07/2022  ? ? ?  ? ? ?Physical Exam  ? ?Triage Vital Signs: ?ED Triage Vitals [04/06/22 1244]  ?Enc Vitals Group  ?   BP (!) 153/88  ?   Pulse Rate (!) 52  ?   Resp 16  ?   Temp 98.3 ?F (36.8 ?C)  ?   Temp Source Oral  ?   SpO2 93 %  ?   Weight 139 lb (63 kg)  ?   Height 5\' 9"  (1.753 m)  ?   Head Circumference   ?   Peak Flow   ?   Pain Score 0  ?   Pain Loc   ?   Pain Edu?   ?   Excl. in GC?   ? ? ?Most recent vital signs: ?Vitals:  ? 04/06/22 1624 04/06/22 1750  ?BP: (!) 141/80 138/78  ?Pulse: 60 68  ?Resp: 16 16  ?Temp:    ?SpO2: 94% 95%  ? ? ?General: Awake, no distress.  ?CV:  Normal femoral pulses ?Resp:  Normal effort.  ?Abd:  No distention. No tenderness to palpation. Mottled skin to abdomen and chest, non tender, no ecchymosis, no involvement of back ?Other:  Rectal: No hemorrhoids. DRE without pain, though small speck of BRB, no stool. Guaiac positive ? ? ?ED Results / Procedures / Treatments   ? ?Labs ?(all labs ordered are listed, but only abnormal results are displayed) ?Labs Reviewed  ?COMPREHENSIVE METABOLIC PANEL - Abnormal; Notable for the following components:  ?    Result Value  ? Glucose, Bld 157 (*)   ? Calcium 8.6 (*)   ? All other components within normal limits  ?CBC - Abnormal; Notable for the following components:  ? WBC 12.4 (*)   ? Platelets 498 (*)   ? All other components within normal limits  ?PROTIME-INR  ?LACTIC ACID, PLASMA  ?LACTIC ACID, PLASMA  ?POC OCCULT BLOOD, ED  ?TYPE AND SCREEN  ? ? ? ?EKG ? ? ? ? ?RADIOLOGY ?I independently reviewed imaging and agree with radiologist findings. ? ? ? ?PROCEDURES: ? ?Critical Care performed:  ? ?Procedures ? ? ?MEDICATIONS ORDERED IN ED: ?Medications  ?iohexol (OMNIPAQUE) 350 MG/ML injection 100 mL (100 mLs Intravenous Contrast Given 04/06/22 1554)  ? ? ? ?IMPRESSION / MDM / ASSESSMENT AND PLAN / ED COURSE  ?I reviewed the triage vital signs and the nursing notes. ? ? ?Differential diagnosis includes,  but is not limited to, lower GI bleed, vascular etiology, hemorrhoids. ? ?Patient is awake and alert, hemodynamically stable and afebrile. Labs obtaiend at triage are reassuring with normal H+H of 46 and 14.9. Normal renal function and LFTs. Guaiac positive on exam, no visible hemorrhoids. Abnormal skin mottling of abdomen noted, though no tenderness. Patient agreed to CTA of abdomen and pelvis.  CT scan demonstrates diverticulosis without acute extravasation or bleed.  Upon reevaluation, patient still declines any pain.  Mottled skin appearance appears to be improved.  Still remains asymptomatic.  Patient had no further bleeding in the emergency department.  He remained hemodynamically stable.  We discussed strict return precautions and the importance of very close outpatient follow-up.  He was given the information for GI to schedule a colonoscopy, as well as to establish care with a primary care provider.  We discussed strict return  precautions.  Patient and his family members understand and agree with plan.  Discharged in stable condition. ? ?This patient was discussed with and also seen by Dr. Larinda Buttery who agrees with assessment and plan. ?  ? ? ?FINAL CLINICAL IMPRESSION(S) / ED DIAGNOSES  ? ?Final diagnoses:  ?Bright red blood per rectum  ? ? ? ?Rx / DC Orders  ? ?ED Discharge Orders   ? ? None  ? ?  ? ? ? ?Note:  This document was prepared using Dragon voice recognition software and may include unintentional dictation errors. ?  ?Jackelyn Hoehn, PA-C ?04/06/22 1930 ? ?  ?Chesley Noon, MD ?04/07/22 1623 ? ?

## 2022-04-06 NOTE — ED Triage Notes (Signed)
Rectal bleeding dark red---like diarrhea since this am.  Only on asa.  Glu 83 vss.  On clopidogrel, atovastatin asa 81.  Felt lightheaded. ?

## 2022-04-06 NOTE — ED Provider Triage Note (Signed)
Emergency Medicine Provider Triage Evaluation Note ? ?George Santos , a 66 y.o. male  was evaluated in triage.  Pt complains of BRBPR beginning today, passing clots. No abdominal pain. No N/V. On plavix. No dizziness/lightheadedness. ? ?Review of Systems  ?Positive: GI bleeding ?Negative: Dizziness, lightheadedness, abdominal pain ? ?Physical Exam  ?There were no vitals taken for this visit. ?Gen:   Awake, no distress   ?Resp:  Normal effort  ?MSK:   Moves extremities without difficulty  ?Other:   ? ?Medical Decision Making  ?Medically screening exam initiated at 12:44 PM.  Appropriate orders placed.  George Santos was informed that the remainder of the evaluation will be completed by another provider, this initial triage assessment does not replace that evaluation, and the importance of remaining in the ED until their evaluation is complete. ? ? ?  ?Jackelyn Hoehn, PA-C ?04/06/22 1247 ? ?

## 2022-04-17 ENCOUNTER — Encounter (INDEPENDENT_AMBULATORY_CARE_PROVIDER_SITE_OTHER): Payer: Self-pay | Admitting: Nurse Practitioner

## 2022-04-17 NOTE — Progress Notes (Signed)
? ?Subjective:  ? ? Patient ID: George Santos, male    DOB: Aug 01, 1956, 66 y.o.   MRN: [id number] ?No chief complaint on file. ? ? ?George Santos is a 66 year old male that recently underwent intervention on 02/20/2022 including: ? ?Procedure(s) Performed: ?            1.  Ultrasound guidance for vascular access left femoral artery ?            2.  Catheter placement into right common femoral artery from left femoral approach ?            3.  Aortogram and selective right lower extremity angiogram ?            4.  Mechanical thrombectomy of right SFA with the [place]ota Rex device ?            5.  Percutaneous transluminal angioplasty of right SFA with 5 mm diameter by 22 cm length Lutonix drug-coated angioplasty balloon ?            6.  StarClose closure device left femoral artery ?The patient does note some improvement post intervention but he still continues to have tightness with walking.  It was noted that during angiogram he has significant disease within his aorta as well as significant disease within his iliac arteries and would best be treated with Endologix stent graft. ? ?Today noninvasive studies show an ABI 0.98 on the right and 0.64 on the left.  The patient has triphasic tibial artery waveforms on the right and monophasic on the left.  They are normal on the right and decreased on the left. ? ? ?Review of Systems  ?Skin:  Negative for wound.  ?All other systems reviewed and are negative. ? ?   ?Objective:  ? Physical Exam ?Vitals reviewed.  ?HENT:  ?   Head: Normocephalic.  ?Cardiovascular:  ?   Rate and Rhythm: Normal rate.  ?   Pulses: Decreased pulses.  ?Pulmonary:  ?   Effort: Pulmonary effort is normal.  ?Skin: ?   General: Skin is warm and dry.  ?Neurological:  ?   Mental Status: He is alert and oriented to person, place, and time.  ?Psychiatric:     ?   Mood and Affect: Mood normal.     ?   Behavior: Behavior normal.     ?   Thought Content: Thought content normal.     ?   Judgment: Judgment normal.  ? ? ?BP  (!) 174/75 (BP Location: Right Arm)   Pulse (!) 56   Resp 17   Ht 5\' 9"  (1.753 m)   Wt 140 lb (63.5 kg)   BMI 20.67 kg/m?  ? ?Past Medical History:  ?Diagnosis Date  ? Arthritis   ? Coronary artery disease   ? Peripheral vascular disease (HCC)   ? ? ?Social History  ? ?Socioeconomic History  ? Marital status: Married  ?  Spouse name: Not on file  ? Number of children: Not on file  ? Years of education: Not on file  ? Highest education level: Not on file  ?Occupational History  ? Not on file  ?Tobacco Use  ? Smoking status: Every Day  ?  Packs/day: 0.50  ?  Years: 45.00  ?  Pack years: 22.50  ?  Types: Cigarettes  ? Smokeless tobacco: Never  ?Vaping Use  ? Vaping Use: Never used  ?Substance and Sexual Activity  ? Alcohol use: Not Currently  ? Drug use:  Never  ? Sexual activity: Yes  ?Other Topics Concern  ? Not on file  ?Social History Narrative  ? Not on file  ? ?Social Determinants of Health  ? ?Financial Resource Strain: Not on file  ?Food Insecurity: Not on file  ?Transportation Needs: Not on file  ?Physical Activity: Not on file  ?Stress: Not on file  ?Social Connections: Not on file  ?Intimate Partner Violence: Not on file  ? ? ?Past Surgical History:  ?Procedure Laterality Date  ? CORONARY ARTERY BYPASS GRAFT    ? HERNIA REPAIR    ? LOWER EXTREMITY ANGIOGRAPHY Right 02/20/2022  ? Procedure: Lower Extremity Angiography;  Surgeon: Annice Needy, MD;  Location: [facility] INVASIVE CV LAB;  Service: Cardiovascular;  Laterality: Right;  ? ? ?History reviewed. No pertinent family history. ? ?No Known Allergies ? ? ?  Latest Ref Rng & Units 04/06/2022  ? 12:53 PM 01/24/2022  ? 10:10 AM 07/12/2013  ?  5:55 PM  ?CBC  ?WBC 4.0 - 10.5 K/uL 12.4   9.7   11.0    ?Hemoglobin 13.0 - 17.0 g/dL 96.0   45.4   09.8    ?Hematocrit 39.0 - 52.0 % 46.4   51.5   49.8    ?Platelets 150 - 400 K/uL 498   349   348    ? ? ? ? ?CMP  ?   ?Component Value Date/Time  ? NA 138 04/06/2022 1253  ? NA 135 (L) 07/12/2013 1755  ? K 4.0 04/06/2022 1253  ?  K 4.0 07/12/2013 1755  ? CL 102 04/06/2022 1253  ? CL 105 07/12/2013 1755  ? CO2 26 04/06/2022 1253  ? CO2 20 (L) 07/12/2013 1755  ? GLUCOSE 157 (H) 04/06/2022 1253  ? GLUCOSE 80 07/12/2013 1755  ? BUN 11 04/06/2022 1253  ? BUN 13 07/12/2013 1755  ? CREATININE 0.97 04/06/2022 1253  ? CREATININE 1.12 07/12/2013 1755  ? CALCIUM 8.6 (L) 04/06/2022 1253  ? CALCIUM 8.5 07/12/2013 1755  ? PROT 7.8 04/06/2022 1253  ? PROT 7.6 07/12/2013 1755  ? ALBUMIN 4.0 04/06/2022 1253  ? ALBUMIN 3.6 07/12/2013 1755  ? AST 17 04/06/2022 1253  ? AST 18 07/12/2013 1755  ? ALT 12 04/06/2022 1253  ? ALT 11 (L) 07/12/2013 1755  ? ALKPHOS 84 04/06/2022 1253  ? ALKPHOS 86 07/12/2013 1755  ? BILITOT 0.7 04/06/2022 1253  ? BILITOT 0.3 07/12/2013 1755  ? GFRNONAA >60 04/06/2022 1253  ? GFRNONAA >60 07/12/2013 1755  ? GFRAA >60 07/12/2013 1755  ? ? ? ?VAS [place] ABI WITH/WO TBI ? ?Result Date: 04/06/2022 ? LOWER EXTREMITY DOPPLER STUDY Patient Name:  George Santos  Date of Exam:   04/05/2022 Medical Rec #: [id number]      Accession #:    [id number] Date of Birth: 1956-10-22      Patient Gender: M Patient Age:   66 years Exam Location:  [facility] Procedure:      VAS [place] ABI WITH/WO TBI Referring Phys: Festus Barren --------------------------------------------------------------------------------  Indications: Peripheral artery disease.  Vascular Interventions: 02/20/2022: Rt Thrombectomy. Performing Technologist: Debbe Bales RVS  Examination Guidelines: A complete evaluation includes at minimum, Doppler waveform signals and systolic blood pressure reading at the level of bilateral brachial, anterior tibial, and posterior tibial arteries, when vessel segments are accessible. Bilateral testing is considered an integral part of a complete examination. Photoelectric Plethysmograph (PPG) waveforms and toe systolic pressure readings are included as required and additional duplex testing as  needed. Limited examinations for reoccurring indications may  be performed as noted.  ABI Findings: +---------+------------------+-----+---------+--------+ Right    Rt Pressure (mmHg)IndexWaveform Comment  +---------+------------------+-----+---------+--------+ Brachial 166                                      +---------+------------------+-----+---------+--------+ ATA      165               0.98 triphasic         +---------+------------------+-----+---------+--------+ PTA      161               0.96 triphasic         +---------+------------------+-----+---------+--------+ Daleen Squibb               0.82 Normal            +---------+------------------+-----+---------+--------+ +---------+------------------+-----+----------+-------+ Left     Lt Pressure (mmHg)IndexWaveform  Comment +---------+------------------+-----+----------+-------+ Brachial 168                                      +---------+------------------+-----+----------+-------+ ATA      96                0.57 monophasic        +---------+------------------+-----+----------+-------+ PTA      107               0.64 monophasic        +---------+------------------+-----+----------+-------+ Great Toe63                0.38 Abnormal          +---------+------------------+-----+----------+-------+ +-------+-----------+-----------+------------+------------+ ABI/TBIToday's ABIToday's TBIPrevious ABIPrevious TBI +-------+-----------+-----------+------------+------------+ Right  .98        .82                                 +-------+-----------+-----------+------------+------------+ Left   .64        .38                                 +-------+-----------+-----------+------------+------------+  Summary: Right: Resting right ankle-brachial index is within normal range. No evidence of significant right lower extremity arterial disease. The right toe-brachial index is normal. Left: Resting left ankle-brachial index indicates moderate left lower  extremity arterial disease. The left toe-brachial index is abnormal.  *See table(s) above for measurements and observations.  Electronically signed by Festus Barren MD on 04/06/2022 at 3:11:16 PM.    Final   ? ? ?   ?Assessment & Plan:  ? ?1. Atherosclerosis of native artery of right lower extre

## 2022-04-17 NOTE — H&P (View-Only) (Signed)
Subjective:    Patient ID: George Santos, male    DOB: February 25, 1956, 66 y.o.   MRN: [id number] No chief complaint on file.   George Santos is a 66 year old male that recently underwent intervention on 02/20/2022 including:  Procedure(s) Performed:             1.  Ultrasound guidance for vascular access left femoral artery             2.  Catheter placement into right common femoral artery from left femoral approach             3.  Aortogram and selective right lower extremity angiogram             4.  Mechanical thrombectomy of right SFA with the [place] Rex device             5.  Percutaneous transluminal angioplasty of right SFA with 5 mm diameter by 22 cm length Lutonix drug-coated angioplasty balloon             6.  StarClose closure device left femoral artery The patient does note some improvement post intervention but George Santos still continues to have tightness with walking.  It was noted that during angiogram George Santos has significant disease within his aorta as well as significant disease within his iliac arteries and would best be treated with Endologix stent graft.  Today noninvasive studies show an ABI 0.98 on the right and 0.64 on the left.  The patient has triphasic tibial artery waveforms on the right and monophasic on the left.  They are normal on the right and decreased on the left.   Review of Systems  Skin:  Negative for wound.  All other systems reviewed and are negative.     Objective:   Physical Exam Vitals reviewed.  HENT:     Head: Normocephalic.  Cardiovascular:     Rate and Rhythm: Normal rate.     Pulses: Decreased pulses.  Pulmonary:     Effort: Pulmonary effort is normal.  Skin:    General: Skin is warm and dry.  Neurological:     Mental Status: George Santos is alert and oriented to person, place, and time.  Psychiatric:        Mood and Affect: Mood normal.        Behavior: Behavior normal.        Thought Content: Thought content normal.        Judgment: Judgment normal.    BP  (!) 174/75 (BP Location: Right Arm)   Pulse (!) 56   Resp 17   Ht 5\' 9"  (1.753 m)   Wt 140 lb (63.5 kg)   BMI 20.67 kg/m   Past Medical History:  Diagnosis Date   Arthritis    Coronary artery disease    Peripheral vascular disease (HCC)     Social History   Socioeconomic History   Marital status: Married    Spouse name: Not on file   Number of children: Not on file   Years of education: Not on file   Highest education level: Not on file  Occupational History   Not on file  Tobacco Use   Smoking status: Every Day    Packs/day: 0.50    Years: 45.00    Pack years: 22.50    Types: Cigarettes   Smokeless tobacco: Never  Vaping Use   Vaping Use: Never used  Substance and Sexual Activity   Alcohol use: Not Currently   Drug use:  Never   Sexual activity: Yes  Other Topics Concern   Not on file  Social History Narrative   Not on file   Social Determinants of Health   Financial Resource Strain: Not on file  Food Insecurity: Not on file  Transportation Needs: Not on file  Physical Activity: Not on file  Stress: Not on file  Social Connections: Not on file  Intimate Partner Violence: Not on file    Past Surgical History:  Procedure Laterality Date   CORONARY ARTERY BYPASS GRAFT     HERNIA REPAIR     LOWER EXTREMITY ANGIOGRAPHY Right 02/20/2022   Procedure: Lower Extremity Angiography;  Surgeon: Annice Needy, MD;  Location: [facility] INVASIVE CV LAB;  Service: Cardiovascular;  Laterality: Right;    History reviewed. No pertinent family history.  No Known Allergies     Latest Ref Rng & Units 04/06/2022   12:53 PM 01/24/2022   10:10 AM 07/12/2013    5:55 PM  CBC  WBC 4.0 - 10.5 K/uL 12.4   9.7   11.0    Hemoglobin 13.0 - 17.0 g/dL 74.2   59.5   63.8    Hematocrit 39.0 - 52.0 % 46.4   51.5   49.8    Platelets 150 - 400 K/uL 498   349   348        CMP     Component Value Date/Time   NA 138 04/06/2022 1253   NA 135 (L) 07/12/2013 1755   K 4.0 04/06/2022 1253    K 4.0 07/12/2013 1755   CL 102 04/06/2022 1253   CL 105 07/12/2013 1755   CO2 26 04/06/2022 1253   CO2 20 (L) 07/12/2013 1755   GLUCOSE 157 (H) 04/06/2022 1253   GLUCOSE 80 07/12/2013 1755   BUN 11 04/06/2022 1253   BUN 13 07/12/2013 1755   CREATININE 0.97 04/06/2022 1253   CREATININE 1.12 07/12/2013 1755   CALCIUM 8.6 (L) 04/06/2022 1253   CALCIUM 8.5 07/12/2013 1755   PROT 7.8 04/06/2022 1253   PROT 7.6 07/12/2013 1755   ALBUMIN 4.0 04/06/2022 1253   ALBUMIN 3.6 07/12/2013 1755   AST 17 04/06/2022 1253   AST 18 07/12/2013 1755   ALT 12 04/06/2022 1253   ALT 11 (L) 07/12/2013 1755   ALKPHOS 84 04/06/2022 1253   ALKPHOS 86 07/12/2013 1755   BILITOT 0.7 04/06/2022 1253   BILITOT 0.3 07/12/2013 1755   GFRNONAA >60 04/06/2022 1253   GFRNONAA >60 07/12/2013 1755   GFRAA >60 07/12/2013 1755     VAS [place] ABI WITH/WO TBI  Result Date: 04/06/2022  LOWER EXTREMITY DOPPLER STUDY Patient Name:  George Santos  Date of Exam:   04/05/2022 Medical Rec #: [id number]      Accession #:    [id number] Date of Birth: [birth year]      Patient Gender: M Patient Age:   66 years Exam Location:  [facility] Procedure:      VAS [place] ABI WITH/WO TBI Referring Phys: Festus Barren --------------------------------------------------------------------------------  Indications: Peripheral artery disease.  Vascular Interventions: 02/20/2022: Rt Thrombectomy. Performing Technologist: Debbe Bales RVS  Examination Guidelines: A complete evaluation includes at minimum, Doppler waveform signals and systolic blood pressure reading at the level of bilateral brachial, anterior tibial, and posterior tibial arteries, when vessel segments are accessible. Bilateral testing is considered an integral part of a complete examination. Photoelectric Plethysmograph (PPG) waveforms and toe systolic pressure readings are included as required and additional duplex testing as  needed. Limited examinations for reoccurring indications may  be performed as noted.  ABI Findings: +---------+------------------+-----+---------+--------+ Right    Rt Pressure (mmHg)IndexWaveform Comment  +---------+------------------+-----+---------+--------+ Brachial 166                                      +---------+------------------+-----+---------+--------+ ATA      165               0.98 triphasic         +---------+------------------+-----+---------+--------+ PTA      161               0.96 triphasic         +---------+------------------+-----+---------+--------+ Daleen Squibb               0.82 Normal            +---------+------------------+-----+---------+--------+ +---------+------------------+-----+----------+-------+ Left     Lt Pressure (mmHg)IndexWaveform  Comment +---------+------------------+-----+----------+-------+ Brachial 168                                      +---------+------------------+-----+----------+-------+ ATA      96                0.57 monophasic        +---------+------------------+-----+----------+-------+ PTA      107               0.64 monophasic        +---------+------------------+-----+----------+-------+ Great Toe63                0.38 Abnormal          +---------+------------------+-----+----------+-------+ +-------+-----------+-----------+------------+------------+ ABI/TBIToday's ABIToday's TBIPrevious ABIPrevious TBI +-------+-----------+-----------+------------+------------+ Right  .98        .82                                 +-------+-----------+-----------+------------+------------+ Left   .64        .38                                 +-------+-----------+-----------+------------+------------+  Summary: Right: Resting right ankle-brachial index is within normal range. No evidence of significant right lower extremity arterial disease. The right toe-brachial index is normal. Left: Resting left ankle-brachial index indicates moderate left lower  extremity arterial disease. The left toe-brachial index is abnormal.  *See table(s) above for measurements and observations.  Electronically signed by Festus Barren MD on 04/06/2022 at 3:11:16 PM.    Final        Assessment & Plan:   1. Atherosclerosis of native artery of right lower extremity with intermittent claudication (HCC) Previous angiogram notes significant aortoiliac disease.  Based on the issues within the aorta as well as the significant iliac disease history this is best repaired as noted by a Endologix stent graft.  The procedure was discussed with the patient.  We discussed the risk benefits and alternatives and George Santos agrees to proceed with intervention. - VAS [place] ABI WITH/WO TBI  2. Essential hypertension Continue antihypertensive medications as already ordered, these medications have been reviewed and there are no changes at this time.   3. Tobacco use disorder Smoking cessation was discussed, 3-10 minutes spent on this topic specifically  Current Outpatient Medications on File Prior to Visit  Medication Sig Dispense Refill   aspirin EC 81 MG tablet Take 81 mg by mouth daily. Swallow whole.     atorvastatin (LIPITOR) 10 MG tablet Take 1 tablet (10 mg total) by mouth daily. 30 tablet 11   clopidogrel (PLAVIX) 75 MG tablet Take 1 tablet (75 mg total) by mouth daily. 30 tablet 11   ibuprofen (ADVIL) 200 MG tablet Take 200 mg by mouth every 6 (six) hours as needed.     meloxicam (MOBIC) 15 MG tablet Take 1 tablet (15 mg total) by mouth daily. (Patient not taking: Reported on 02/07/2022) 30 tablet 2   No current facility-administered medications on file prior to visit.    There are no Patient Instructions on file for this visit. No follow-ups on file.   Georgiana Spinner, NP

## 2022-04-19 ENCOUNTER — Other Ambulatory Visit (INDEPENDENT_AMBULATORY_CARE_PROVIDER_SITE_OTHER): Payer: Self-pay | Admitting: Nurse Practitioner

## 2022-04-19 ENCOUNTER — Encounter
Admission: RE | Admit: 2022-04-19 | Discharge: 2022-04-19 | Disposition: A | Discharge status: Home / Self Care | Payer: Medicare Other | Source: Ambulatory Visit | Attending: Vascular Surgery | Admitting: Vascular Surgery

## 2022-04-19 ENCOUNTER — Inpatient Hospital Stay: Admission: RE | Admit: 2022-04-19 | Payer: Medicare Other | Source: Ambulatory Visit

## 2022-04-19 VITALS — Ht 69.0 in | Wt 139.0 lb

## 2022-04-19 DIAGNOSIS — I70211 Atherosclerosis of native arteries of extremities with intermittent claudication, right leg: Secondary | ICD-10-CM

## 2022-04-19 DIAGNOSIS — Z01812 Encounter for preprocedural laboratory examination: Secondary | ICD-10-CM

## 2022-04-19 HISTORY — DX: Personal history of other diseases of the digestive system: Z87.19

## 2022-04-19 NOTE — Patient Instructions (Addendum)
Your procedure is scheduled on: Wednesday, May 24 ?Report to the Registration Desk on the 1st floor of the [facility]. ?To find out your arrival time, please call [phone] between 1PM - 3PM on: Tuesday, May 23 ?If your arrival time is 6:00 am, do not arrive prior to that time as the [facility] entrance doors do not open until 6:00 am. ? ?REMEMBER: ?Instructions that are not followed completely may result in serious medical risk, up to and including death; or upon the discretion of your surgeon and anesthesiologist your surgery may need to be rescheduled. ? ?Do not eat or drink after midnight the night before surgery.  ?No gum chewing, lozengers or hard candies. ? ?TAKE THESE MEDICATIONS THE MORNING OF SURGERY WITH A SIP OF WATER: ? ?Atorvastatin ? ?According to Dr. Bunnie Domino standing orders:     Continue taking the clopidogrel (plavix) and aspirin. ? ?One week prior to surgery: starting May 17 ?Stop Anti-inflammatories (NSAIDS) such as Advil, Aleve, Ibuprofen, Motrin, Naproxen, Naprosyn and Aspirin based products such as Excedrin, Goodys Powder, BC Powder. ?Stop ANY OVER THE COUNTER supplements until after surgery. ?You may however, continue to take Tylenol if needed for pain up until the day of surgery. ? ?No Alcohol for 24 hours before or after surgery. ? ?No Smoking including e-cigarettes for 24 hours prior to surgery.  ?No chewable tobacco products for at least 6 hours prior to surgery.  ?No nicotine patches on the day of surgery. ? ?Do not use any "recreational" drugs for at least a week prior to your surgery.  ?Please be advised that the combination of cocaine and anesthesia may have negative outcomes, up to and including death. ?If you test positive for cocaine, your surgery will be cancelled. ? ?On the morning of surgery brush your teeth with toothpaste and water, you may rinse your mouth with mouthwash if you wish. ?Do not swallow any toothpaste or mouthwash. ? ?Use CHG Soap or wipes as directed on  instruction sheet. ? ?Do not wear jewelry, make-up, hairpins, clips or nail polish. ? ?Do not wear lotions, powders, or perfumes.  ? ?Do not shave body from the neck down 48 hours prior to surgery just in case you cut yourself which could leave a site for infection.  ?Also, freshly shaved skin may become irritated if using the CHG soap. ? ?Contact lenses, hearing aids and dentures may not be worn into surgery. ? ?Do not bring valuables to the hospital. [facility] is not responsible for any missing/lost belongings or valuables.  ? ?Notify your doctor if there is any change in your medical condition (cold, fever, infection). ? ?Wear comfortable clothing (specific to your surgery type) to the hospital. ? ?After surgery, you can help prevent lung complications by doing breathing exercises.  ?Take deep breaths and cough every 1-2 hours. Your doctor may order a device called an Incentive Spirometer to help you take deep breaths. ? ?If you are being admitted to the hospital overnight, leave your suitcase in the car. ?After surgery it may be brought to your room. ? ?If you are being discharged the day of surgery, you will not be allowed to drive home. ?You will need a responsible adult (18 years or older) to drive you home and stay with you that night.  ? ?If you are taking public transportation, you will need to have a responsible adult (18 years or older) with you. ?Please confirm with your physician that it is acceptable to use public transportation.  ? ?  Please call the [facility] at [phone] if you have any questions about these instructions. ? ?Surgery Visitation Policy: ? ?Patients undergoing a surgery or procedure may have two family members or support persons with them as long as the person is not COVID-19 positive or experiencing its symptoms.  ? ?Inpatient Visitation:   ? ?Visiting hours are 7 a.m. to 8 p.m. ?Up to four visitors are allowed at one time in a patient room, including  children. The visitors may rotate out with other people during the day. One designated support person (adult) may remain overnight.  ?

## 2022-04-20 ENCOUNTER — Encounter: Payer: Self-pay | Admitting: Vascular Surgery

## 2022-04-21 ENCOUNTER — Encounter: Payer: Self-pay | Admitting: Urgent Care

## 2022-04-21 ENCOUNTER — Encounter
Admission: RE | Admit: 2022-04-21 | Discharge: 2022-04-21 | Disposition: A | Discharge status: Home / Self Care | Payer: Medicare Other | Source: Ambulatory Visit | Attending: Vascular Surgery | Admitting: Vascular Surgery

## 2022-04-21 DIAGNOSIS — I70211 Atherosclerosis of native arteries of extremities with intermittent claudication, right leg: Secondary | ICD-10-CM | POA: Diagnosis not present

## 2022-04-21 DIAGNOSIS — Z01818 Encounter for other preprocedural examination: Secondary | ICD-10-CM | POA: Insufficient documentation

## 2022-04-21 DIAGNOSIS — Z01812 Encounter for preprocedural laboratory examination: Secondary | ICD-10-CM

## 2022-04-21 LAB — TYPE AND SCREEN
ABO/RH(D): A POS
Antibody Screen: NEGATIVE

## 2022-04-21 LAB — SURGICAL PCR SCREEN
MRSA, PCR: NEGATIVE
Staphylococcus aureus: NEGATIVE

## 2022-04-26 ENCOUNTER — Inpatient Hospital Stay: Payer: Medicare Other | Admitting: Urgent Care

## 2022-04-26 ENCOUNTER — Encounter: Payer: Self-pay | Admitting: Vascular Surgery

## 2022-04-26 ENCOUNTER — Inpatient Hospital Stay
Admission: RE | Admit: 2022-04-26 | Discharge: 2022-04-27 | DRG: 269 | Disposition: A | Discharge status: Home / Self Care | Payer: Medicare Other | Attending: Vascular Surgery | Admitting: Vascular Surgery

## 2022-04-26 ENCOUNTER — Encounter
Admission: RE | Disposition: A | Discharge status: Home / Self Care | Payer: Self-pay | Source: Home / Self Care | Attending: Vascular Surgery

## 2022-04-26 ENCOUNTER — Other Ambulatory Visit: Payer: Self-pay

## 2022-04-26 DIAGNOSIS — I70299 Other atherosclerosis of native arteries of extremities, unspecified extremity: Secondary | ICD-10-CM

## 2022-04-26 DIAGNOSIS — I513 Intracardiac thrombosis, not elsewhere classified: Secondary | ICD-10-CM | POA: Diagnosis present

## 2022-04-26 DIAGNOSIS — I77819 Aortic ectasia, unspecified site: Secondary | ICD-10-CM | POA: Diagnosis present

## 2022-04-26 DIAGNOSIS — I700 Atherosclerosis of aorta: Secondary | ICD-10-CM | POA: Diagnosis present

## 2022-04-26 DIAGNOSIS — I70223 Atherosclerosis of native arteries of extremities with rest pain, bilateral legs: Secondary | ICD-10-CM | POA: Diagnosis present

## 2022-04-26 DIAGNOSIS — Z79899 Other long term (current) drug therapy: Secondary | ICD-10-CM | POA: Diagnosis not present

## 2022-04-26 DIAGNOSIS — I252 Old myocardial infarction: Secondary | ICD-10-CM | POA: Diagnosis not present

## 2022-04-26 DIAGNOSIS — I2510 Atherosclerotic heart disease of native coronary artery without angina pectoris: Secondary | ICD-10-CM | POA: Diagnosis present

## 2022-04-26 DIAGNOSIS — I70213 Atherosclerosis of native arteries of extremities with intermittent claudication, bilateral legs: Secondary | ICD-10-CM | POA: Diagnosis present

## 2022-04-26 DIAGNOSIS — Z7902 Long term (current) use of antithrombotics/antiplatelets: Secondary | ICD-10-CM

## 2022-04-26 DIAGNOSIS — Z951 Presence of aortocoronary bypass graft: Secondary | ICD-10-CM

## 2022-04-26 DIAGNOSIS — Z791 Long term (current) use of non-steroidal anti-inflammatories (NSAID): Secondary | ICD-10-CM

## 2022-04-26 DIAGNOSIS — E785 Hyperlipidemia, unspecified: Secondary | ICD-10-CM | POA: Diagnosis present

## 2022-04-26 DIAGNOSIS — I10 Essential (primary) hypertension: Secondary | ICD-10-CM | POA: Diagnosis present

## 2022-04-26 DIAGNOSIS — F17210 Nicotine dependence, cigarettes, uncomplicated: Secondary | ICD-10-CM | POA: Diagnosis present

## 2022-04-26 DIAGNOSIS — Z7982 Long term (current) use of aspirin: Secondary | ICD-10-CM | POA: Diagnosis not present

## 2022-04-26 HISTORY — PX: ENDOVASCULAR REPAIR/STENT GRAFT: CATH118280

## 2022-04-26 HISTORY — DX: Essential (primary) hypertension: I10

## 2022-04-26 HISTORY — DX: Hyperlipidemia, unspecified: E78.5

## 2022-04-26 HISTORY — DX: Long term (current) use of antithrombotics/antiplatelets: Z79.02

## 2022-04-26 HISTORY — DX: Atherosclerosis of aorta: I70.0

## 2022-04-26 HISTORY — DX: Diverticulosis of intestine, part unspecified, without perforation or abscess without bleeding: K57.90

## 2022-04-26 LAB — CBC
HCT: 36.4 % — ABNORMAL LOW (ref 39.0–52.0)
HCT: 30.3 % — ABNORMAL LOW (ref 39.0–52.0)
Hemoglobin: 11.2 g/dL — ABNORMAL LOW (ref 13.0–17.0)
Hemoglobin: 9.4 g/dL — ABNORMAL LOW (ref 13.0–17.0)
MCH: 27.7 pg (ref 26.0–34.0)
MCH: 27.6 pg (ref 26.0–34.0)
MCHC: 30.8 g/dL (ref 30.0–36.0)
MCHC: 31 g/dL (ref 30.0–36.0)
MCV: 89.9 fL (ref 80.0–100.0)
MCV: 89.1 fL (ref 80.0–100.0)
Platelets: 564 10*3/uL — ABNORMAL HIGH (ref 150–400)
Platelets: 437 10*3/uL — ABNORMAL HIGH (ref 150–400)
RBC: 4.05 MIL/uL — ABNORMAL LOW (ref 4.22–5.81)
RBC: 3.4 MIL/uL — ABNORMAL LOW (ref 4.22–5.81)
RDW: 13.8 % (ref 11.5–15.5)
RDW: 13.8 % (ref 11.5–15.5)
WBC: 12.8 10*3/uL — ABNORMAL HIGH (ref 4.0–10.5)
WBC: 12.2 10*3/uL — ABNORMAL HIGH (ref 4.0–10.5)
nRBC: 0 % (ref 0.0–0.2)
nRBC: 0 % (ref 0.0–0.2)

## 2022-04-26 LAB — BASIC METABOLIC PANEL
Anion gap: 10 (ref 5–15)
Anion gap: 7 (ref 5–15)
BUN: 10 mg/dL (ref 8–23)
BUN: 9 mg/dL (ref 8–23)
CO2: 24 mmol/L (ref 22–32)
CO2: 26 mmol/L (ref 22–32)
Calcium: 8.7 mg/dL — ABNORMAL LOW (ref 8.9–10.3)
Calcium: 8.1 mg/dL — ABNORMAL LOW (ref 8.9–10.3)
Chloride: 103 mmol/L (ref 98–111)
Chloride: 105 mmol/L (ref 98–111)
Creatinine, Ser: 0.93 mg/dL (ref 0.61–1.24)
Creatinine, Ser: 0.89 mg/dL (ref 0.61–1.24)
GFR, Estimated: >60 mL/min (ref >60)
GFR, Estimated: >60 mL/min (ref >60)
Glucose, Bld: 98 mg/dL (ref 70–99)
Glucose, Bld: 110 mg/dL — ABNORMAL HIGH (ref 70–99)
Potassium: 3.5 mmol/L (ref 3.5–5.1)
Potassium: 3.6 mmol/L (ref 3.5–5.1)
Sodium: 137 mmol/L (ref 135–145)
Sodium: 138 mmol/L (ref 135–145)

## 2022-04-26 LAB — PROTIME-INR
INR: 1.2 (ref 0.8–1.2)
Prothrombin Time: 15.3 seconds — ABNORMAL HIGH (ref 11.4–15.2)

## 2022-04-26 LAB — GLUCOSE, CAPILLARY: Glucose-Capillary: 119 mg/dL — ABNORMAL HIGH (ref 70–99)

## 2022-04-26 LAB — MAGNESIUM: Magnesium: 1.9 mg/dL (ref 1.7–2.4)

## 2022-04-26 LAB — APTT: aPTT: >200 seconds (ref 24–36)

## 2022-04-26 SURGERY — ENDOVASCULAR STENT GRAFT (AAA)
Anesthesia: General

## 2022-04-26 MED ORDER — NITROGLYCERIN IN D5W 200-5 MCG/ML-% IV SOLN: 5.0000 ug/min | INTRAVENOUS | Status: DC

## 2022-04-26 MED ORDER — CEFAZOLIN SODIUM-DEXTROSE 2-4 GM/100ML-% IV SOLN
INTRAVENOUS | Status: AC
  Filled 2022-04-26: qty 100

## 2022-04-26 MED ORDER — FENTANYL CITRATE (PF) 100 MCG/2ML IJ SOLN
INTRAMUSCULAR | Status: AC
  Filled 2022-04-26: qty 2

## 2022-04-26 MED ORDER — SODIUM CHLORIDE 0.9 % IV SOLN: 500.0000 mL | Freq: Once | INTRAVENOUS | Status: DC | PRN

## 2022-04-26 MED ORDER — CEFAZOLIN SODIUM-DEXTROSE 2-4 GM/100ML-% IV SOLN
2.0000 g | Freq: Three times a day (TID) | INTRAVENOUS | Status: AC
  Administered 2022-04-26: 2 g via INTRAVENOUS
  Filled 2022-04-26: qty 100

## 2022-04-26 MED ORDER — MIDAZOLAM HCL 2 MG/2ML IJ SOLN
INTRAMUSCULAR | Status: AC
  Filled 2022-04-26: qty 2

## 2022-04-26 MED ORDER — PROPOFOL 10 MG/ML IV BOLUS
INTRAVENOUS | Status: AC
  Filled 2022-04-26: qty 20

## 2022-04-26 MED ORDER — DEXAMETHASONE SODIUM PHOSPHATE 10 MG/ML IJ SOLN
INTRAMUSCULAR | Status: DC | PRN
  Administered 2022-04-26: 10 mg via INTRAVENOUS

## 2022-04-26 MED ORDER — PHENOL 1.4 % MT LIQD
1.0000 | OROMUCOSAL | Status: DC | PRN
  Filled 2022-04-26: qty 177

## 2022-04-26 MED ORDER — DEXAMETHASONE SODIUM PHOSPHATE 10 MG/ML IJ SOLN
INTRAMUSCULAR | Status: AC
  Filled 2022-04-26: qty 1

## 2022-04-26 MED ORDER — HEPARIN SODIUM (PORCINE) 1000 UNIT/ML IJ SOLN
INTRAMUSCULAR | Status: DC | PRN
  Administered 2022-04-26: 5000 [IU] via INTRAVENOUS

## 2022-04-26 MED ORDER — EPHEDRINE SULFATE (PRESSORS) 50 MG/ML IJ SOLN
INTRAMUSCULAR | Status: DC | PRN
  Administered 2022-04-26: 5 mg via INTRAVENOUS

## 2022-04-26 MED ORDER — LACTATED RINGERS IV SOLN: INTRAVENOUS | Status: DC

## 2022-04-26 MED ORDER — METOPROLOL TARTRATE 5 MG/5ML IV SOLN: 2.0000 mg | INTRAVENOUS | Status: DC | PRN

## 2022-04-26 MED ORDER — ORAL CARE MOUTH RINSE: 15.0000 mL | Freq: Once | OROMUCOSAL | Status: AC

## 2022-04-26 MED ORDER — OXYCODONE HCL 5 MG/5ML PO SOLN
5.0000 mg | Freq: Once | ORAL | Status: DC | PRN
  Filled 2022-04-26: qty 5

## 2022-04-26 MED ORDER — ONDANSETRON HCL 4 MG/2ML IJ SOLN
INTRAMUSCULAR | Status: DC | PRN
  Administered 2022-04-26: 4 mg via INTRAVENOUS

## 2022-04-26 MED ORDER — MAGNESIUM SULFATE 2 GM/50ML IV SOLN
2.0000 g | Freq: Every day | INTRAVENOUS | Status: DC | PRN
  Filled 2022-04-26: qty 50

## 2022-04-26 MED ORDER — MORPHINE SULFATE (PF) 2 MG/ML IV SOLN: 2.0000 mg | INTRAVENOUS | Status: DC | PRN

## 2022-04-26 MED ORDER — GUAIFENESIN-DM 100-10 MG/5ML PO SYRP: 15.0000 mL | ORAL_SOLUTION | ORAL | Status: DC | PRN

## 2022-04-26 MED ORDER — CHLORHEXIDINE GLUCONATE CLOTH 2 % EX PADS: 6.0000 | MEDICATED_PAD | Freq: Once | CUTANEOUS | Status: AC

## 2022-04-26 MED ORDER — ASPIRIN 81 MG PO TBEC
81.0000 mg | DELAYED_RELEASE_TABLET | Freq: Every day | ORAL | Status: DC
  Administered 2022-04-27: 81 mg via ORAL
  Filled 2022-04-26: qty 1

## 2022-04-26 MED ORDER — HYDROMORPHONE HCL 1 MG/ML IJ SOLN: 1.0000 mg | Freq: Once | INTRAMUSCULAR | Status: DC | PRN

## 2022-04-26 MED ORDER — IODIXANOL 320 MG/ML IV SOLN
INTRAVENOUS | Status: DC | PRN
  Administered 2022-04-26: 50 mL via INTRA_ARTERIAL

## 2022-04-26 MED ORDER — POTASSIUM CHLORIDE CRYS ER 20 MEQ PO TBCR: 20.0000 meq | EXTENDED_RELEASE_TABLET | Freq: Every day | ORAL | Status: DC | PRN

## 2022-04-26 MED ORDER — FENTANYL CITRATE (PF) 100 MCG/2ML IJ SOLN
25.0000 ug | INTRAMUSCULAR | Status: AC | PRN
  Administered 2022-04-26 (×2): 50 ug via INTRAVENOUS
  Administered 2022-04-26 (×2): 25 ug via INTRAVENOUS

## 2022-04-26 MED ORDER — OXYCODONE-ACETAMINOPHEN 5-325 MG PO TABS: 1.0000 | ORAL_TABLET | ORAL | Status: DC | PRN

## 2022-04-26 MED ORDER — LIDOCAINE HCL (PF) 2 % IJ SOLN
INTRAMUSCULAR | Status: AC
  Filled 2022-04-26: qty 5

## 2022-04-26 MED ORDER — CHLORHEXIDINE GLUCONATE CLOTH 2 % EX PADS
6.0000 | MEDICATED_PAD | Freq: Once | CUTANEOUS | Status: AC
  Administered 2022-04-26: 6 via TOPICAL

## 2022-04-26 MED ORDER — DOPAMINE-DEXTROSE 3.2-5 MG/ML-% IV SOLN: 3.0000 ug/kg/min | INTRAVENOUS | Status: DC

## 2022-04-26 MED ORDER — CEFAZOLIN SODIUM-DEXTROSE 2-4 GM/100ML-% IV SOLN
INTRAVENOUS | Status: AC
  Administered 2022-04-26: 2 g via INTRAVENOUS
  Filled 2022-04-26: qty 100

## 2022-04-26 MED ORDER — FAMOTIDINE IN NACL 20-0.9 MG/50ML-% IV SOLN
20.0000 mg | Freq: Two times a day (BID) | INTRAVENOUS | Status: DC
  Administered 2022-04-26: 20 mg via INTRAVENOUS
  Filled 2022-04-26: qty 50

## 2022-04-26 MED ORDER — PROPOFOL 10 MG/ML IV BOLUS
INTRAVENOUS | Status: DC | PRN
  Administered 2022-04-26: 50 mg via INTRAVENOUS
  Administered 2022-04-26: 150 mg via INTRAVENOUS

## 2022-04-26 MED ORDER — SEVOFLURANE IN SOLN
RESPIRATORY_TRACT | Status: AC
  Filled 2022-04-26: qty 250

## 2022-04-26 MED ORDER — LABETALOL HCL 5 MG/ML IV SOLN: 10.0000 mg | INTRAVENOUS | Status: DC | PRN

## 2022-04-26 MED ORDER — ACETAMINOPHEN 650 MG RE SUPP: 325.0000 mg | RECTAL | Status: DC | PRN

## 2022-04-26 MED ORDER — CEFAZOLIN SODIUM-DEXTROSE 2-4 GM/100ML-% IV SOLN
2.0000 g | INTRAVENOUS | Status: AC
  Administered 2022-04-26: 2 g via INTRAVENOUS

## 2022-04-26 MED ORDER — OXYCODONE HCL 5 MG PO TABS: 5.0000 mg | ORAL_TABLET | Freq: Once | ORAL | Status: DC | PRN

## 2022-04-26 MED ORDER — ACETAMINOPHEN 325 MG PO TABS: 650.0000 mg | ORAL_TABLET | Freq: Four times a day (QID) | ORAL | Status: DC | PRN

## 2022-04-26 MED ORDER — FENTANYL CITRATE (PF) 100 MCG/2ML IJ SOLN
INTRAMUSCULAR | Status: AC
  Administered 2022-04-26: 25 ug via INTRAVENOUS
  Filled 2022-04-26: qty 2

## 2022-04-26 MED ORDER — PHENYLEPHRINE HCL-NACL 20-0.9 MG/250ML-% IV SOLN
INTRAVENOUS | Status: AC
  Filled 2022-04-26: qty 250

## 2022-04-26 MED ORDER — FAMOTIDINE 20 MG PO TABS
ORAL_TABLET | ORAL | Status: AC
  Administered 2022-04-26: 20 mg via ORAL
  Filled 2022-04-26: qty 1

## 2022-04-26 MED ORDER — SUGAMMADEX SODIUM 200 MG/2ML IV SOLN
INTRAVENOUS | Status: DC | PRN
Start: 2022-04-26 — End: 2022-04-26
  Administered 2022-04-26: 200 mg via INTRAVENOUS

## 2022-04-26 MED ORDER — ONDANSETRON HCL 4 MG/2ML IJ SOLN: 4.0000 mg | Freq: Four times a day (QID) | INTRAMUSCULAR | Status: DC | PRN

## 2022-04-26 MED ORDER — ATORVASTATIN CALCIUM 10 MG PO TABS
10.0000 mg | ORAL_TABLET | Freq: Every day | ORAL | Status: DC
  Administered 2022-04-27: 10 mg via ORAL
  Filled 2022-04-26: qty 1

## 2022-04-26 MED ORDER — ROCURONIUM BROMIDE 100 MG/10ML IV SOLN
INTRAVENOUS | Status: DC | PRN
  Administered 2022-04-26: 60 mg via INTRAVENOUS

## 2022-04-26 MED ORDER — CHLORHEXIDINE GLUCONATE 0.12 % MT SOLN
15.0000 mL | Freq: Once | OROMUCOSAL | Status: AC
  Administered 2022-04-26: 15 mL via OROMUCOSAL
  Filled 2022-04-26: qty 15

## 2022-04-26 MED ORDER — MIDAZOLAM HCL 2 MG/2ML IJ SOLN
INTRAMUSCULAR | Status: DC | PRN
  Administered 2022-04-26: 2 mg via INTRAVENOUS

## 2022-04-26 MED ORDER — DOCUSATE SODIUM 100 MG PO CAPS
100.0000 mg | ORAL_CAPSULE | Freq: Every day | ORAL | Status: DC
  Administered 2022-04-27: 100 mg via ORAL
  Filled 2022-04-26: qty 1

## 2022-04-26 MED ORDER — PROPOFOL 10 MG/ML IV BOLUS
INTRAVENOUS | Status: AC
Start: 2022-04-26 — End: ?
  Filled 2022-04-26: qty 20

## 2022-04-26 MED ORDER — PHENYLEPHRINE HCL-NACL 20-0.9 MG/250ML-% IV SOLN
INTRAVENOUS | Status: DC | PRN
  Administered 2022-04-26: 25 ug/min via INTRAVENOUS

## 2022-04-26 MED ORDER — FENTANYL CITRATE (PF) 100 MCG/2ML IJ SOLN
INTRAMUSCULAR | Status: DC | PRN
  Administered 2022-04-26: 50 ug via INTRAVENOUS
  Administered 2022-04-26 (×2): 25 ug via INTRAVENOUS

## 2022-04-26 MED ORDER — ONDANSETRON HCL 4 MG/2ML IJ SOLN
INTRAMUSCULAR | Status: AC
  Filled 2022-04-26: qty 2

## 2022-04-26 MED ORDER — ACETAMINOPHEN 325 MG PO TABS
325.0000 mg | ORAL_TABLET | ORAL | Status: DC | PRN
  Administered 2022-04-26: 650 mg via ORAL
  Filled 2022-04-26: qty 2

## 2022-04-26 MED ORDER — SODIUM CHLORIDE 0.9 % IV SOLN: INTRAVENOUS | Status: DC

## 2022-04-26 MED ORDER — FAMOTIDINE 20 MG PO TABS
20.0000 mg | ORAL_TABLET | Freq: Once | ORAL | Status: AC
Start: 2022-04-26 — End: 2022-04-26

## 2022-04-26 MED ORDER — CLOPIDOGREL BISULFATE 75 MG PO TABS
75.0000 mg | ORAL_TABLET | Freq: Every day | ORAL | Status: DC
  Administered 2022-04-27: 75 mg via ORAL
  Filled 2022-04-26: qty 1

## 2022-04-26 MED ORDER — LIDOCAINE HCL (CARDIAC) PF 100 MG/5ML IV SOSY
PREFILLED_SYRINGE | INTRAVENOUS | Status: DC | PRN
Start: 2022-04-26 — End: 2022-04-26
  Administered 2022-04-26: 80 mg via INTRAVENOUS

## 2022-04-26 MED ORDER — ALUM & MAG HYDROXIDE-SIMETH 200-200-20 MG/5ML PO SUSP: 15.0000 mL | ORAL | Status: DC | PRN

## 2022-04-26 MED ORDER — HYDRALAZINE HCL 20 MG/ML IJ SOLN: 5.0000 mg | INTRAMUSCULAR | Status: DC | PRN

## 2022-04-26 MED ORDER — ROCURONIUM BROMIDE 10 MG/ML (PF) SYRINGE
PREFILLED_SYRINGE | INTRAVENOUS | Status: AC
  Filled 2022-04-26: qty 10

## 2022-04-26 SURGICAL SUPPLY — 30 items
BALLN ULTRVRSE 9X40X75C (BALLOONS) ×4 IMPLANT
BALLOON ULTRVRSE 9X40X75C (BALLOONS) IMPLANT
CATH ACCU-VU SIZ PIG 5F 70CM (CATHETERS) ×1 IMPLANT
CATH BALLN CODA 9X100X32 (BALLOONS) ×1 IMPLANT
CLOSURE PERCLOSE PROSTYLE (VASCULAR PRODUCTS) ×4 IMPLANT
COVER DRAPE FLUORO 36X44 (DRAPES) ×2 IMPLANT
COVER PROBE U/S 5X48 (MISCELLANEOUS) ×1 IMPLANT
DEVICE SAFEGUARD 24CM (GAUZE/BANDAGES/DRESSINGS) ×2 IMPLANT
DEVICE STARCLOSE SE CLOSURE (Vascular Products) ×1 IMPLANT
DEVICE TORQUE .025-.038 (MISCELLANEOUS) ×1 IMPLANT
ENSNARE 18-30 (MISCELLANEOUS) ×1 IMPLANT
KIT ENCORE 26 ADVANTAGE (KITS) ×2 IMPLANT
NDL ENTRY 21GA 7CM ECHOTIP (NEEDLE) IMPLANT
NEEDLE ENTRY 21GA 7CM ECHOTIP (NEEDLE) ×2 IMPLANT
PACK ANGIOGRAPHY (CUSTOM PROCEDURE TRAY) ×2 IMPLANT
SET INTRO CAPELLA COAXIAL (SET/KITS/TRAYS/PACK) ×1 IMPLANT
SHEATH AFX SYS S1745 (SHEATH) ×1 IMPLANT
SHEATH BRITE TIP 6FRX11 (SHEATH) ×1 IMPLANT
SHEATH BRITE TIP 7FRX11 (SHEATH) ×1 IMPLANT
SHEATH BRITE TIP 8FRX11 (SHEATH) ×1 IMPLANT
SPONGE XRAY 4X4 16PLY STRL (MISCELLANEOUS) ×3 IMPLANT
STENT GRAFT AFX 22X60/13X14 (Endovascular Graft) ×1 IMPLANT
STENT GRAFT VELACUFF 23-23/C75 (Endovascular Graft) ×1 IMPLANT
STENT GRAFT VELACUFF 25-25/C75 (Endovascular Graft) IMPLANT
SYR MEDRAD MARK 7 150ML (SYRINGE) ×1 IMPLANT
TUBING CONTRAST HIGH PRESS 72 (TUBING) ×1 IMPLANT
VALVE CHECKFLO PERFORMER (SHEATH) ×1 IMPLANT
WIRE G LUND 35X260X7 (WIRE) ×1 IMPLANT
WIRE GUIDERIGHT .035X150 (WIRE) ×1 IMPLANT
WIRE MAGIC TORQUE 260C (WIRE) ×1 IMPLANT

## 2022-04-26 NOTE — Anesthesia Procedure Notes (Addendum)
Procedure Name: Intubation Date/Time: 04/26/2022 1:49 PM Performed by: Kizzie Furnish, RN Pre-anesthesia Checklist: Patient identified, Patient being monitored, Timeout performed, Emergency Drugs available and Suction available Patient Re-evaluated:Patient Re-evaluated prior to induction Oxygen Delivery Method: Circle system utilized Preoxygenation: Pre-oxygenation with 100% oxygen Induction Type: IV induction Ventilation: Mask ventilation without difficulty Laryngoscope Size: 4 and McGraph Grade View: Grade I Tube type: Oral Tube size: 7.5 mm Number of attempts: 1 Airway Equipment and Method: Stylet Placement Confirmation: ETT inserted through vocal cords under direct vision, positive ETCO2 and breath sounds checked- equal and bilateral Secured at: 21 cm Tube secured with: Tape Dental Injury: Teeth and Oropharynx as per pre-operative assessment

## 2022-04-26 NOTE — Transfer of Care (Signed)
Immediate Anesthesia Transfer of Care Note  Patient: George Santos  Procedure(s) Performed: ENDOVASCULAR REPAIR/STENT GRAFT  Patient Location: PACU  Anesthesia Type:General  Level of Consciousness: awake, alert  and oriented  Airway & Oxygen Therapy: Patient Spontanous Breathing and Patient connected to face mask oxygen  Post-op Assessment: Report given to RN and Post -op Vital signs reviewed and stable  Post vital signs: Reviewed and stable  Last Vitals:  Vitals Value Taken Time  BP 165/79 04/26/22 1530  Temp    Pulse 50 04/26/22 1532  Resp 19 04/26/22 1532  SpO2 100 % 04/26/22 1532  Vitals shown include unvalidated device data.  Last Pain:  Vitals:   04/26/22 1201  TempSrc: Oral  PainSc: 0-No pain         Complications: No notable events documented.

## 2022-04-26 NOTE — Op Note (Signed)
OPERATIVE NOTE   PROCEDURE: US guidance for vascular access, bilateral femoral arteries Catheter placement into aorta from bilateral femoral approaches Placement of a 22 mm proximal 60 mm proximal length with 13 mm x 40 mm iliac limbs Endologix aortic endoprosthesis main body  Placement of an aorta endograft extender with an Endologix 25 mm diameter by 75 mm length proximal extension limb ProGlide closure devices right femoral artery with a Star close device left common femoral artery.  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  FLUOROSCOPY TIME: 9.3  FINDING(S): 1.  Critical distal aortic atherosclerotic occlusive disease associated with bilateral common iliac artery occlusive disease  SPECIMEN(S):  none  INDICATIONS:   Patient is a 66 year old male who presents with lifestyle limiting claudication and mild rest pain.  Work-up demonstrates the patient is a suitable candidate for aortobiiliac stenting using the Endologix stent graft.  The risks and benefits have been reviewed with the patient alternative therapies including open aortobifemoral bypass was discussed.  All questions have been answered.  The patient is voicing severe lifestyle limitation and therefore wishes to proceed with endograft repair.  DESCRIPTION: After obtaining full informed written consent, the patient was brought back to the operating room and placed supine upon the operating table.  The patient received IV antibiotics prior to induction.  After obtaining adequate anesthesia, the patient was prepped and draped in the standard fashion for endovascular aortoiliac stent graft  placement.  Co-surgeons are required because this is a complex bilateral procedure with work being performed simultaneously from both the right femoral and left femoral approach.  This also expedites the procedure making a shorter operative time reducing complications and improving patient safety.  We then began by gaining access to both femoral arteries with US guidance with me working on the right and Dr. Delana Meyer working on the left.  The femoral arteries were found to be patent and accessed without difficulty with a needle under ultrasound guidance without difficulty on each side and permanent images were recorded.  We then placed 2 proglide devices on the right side in a pre-close fashion followed by placement of an 8 French sheath.  The left side was then accessed with ultrasound as described above and a J-wire was advanced without difficulty.  A 7 French sheath was placed  The patient was then given 5000 units of intravenous heparin.   The Pigtail catheter was placed into the aorta from the left side. Using this image, we selected a 22 mm diameter proximal 60 mm length proximal, 13 mm diameter iliac limbs Endologix device device.  A Lunderquist wire was then advanced up the right side.  The delivery sheath was advanced over a stiff wire and position with the tip of the sheath just above the aortic bifurcation.   The 22 mm diameter by 60 mm length Endologix main body was then placed into the delivery sheath and the contralateral snare wire advanced through the delivery sheath and positioned at the tip of the sheath.  Working from the left side a trilobed snare was advanced through the 7 French sheath and positioned several centimeters above the tip of the delivery sheath.  The contralateral snare wire was then advanced through the snare and the snare  used to capture the contralateral wire.  The wire was then fed from the ipsilateral side while it was pulled out the 7 [place] sheath.  Taking care and making  adjustments to ensure there was no wire wrap.  The Endologix main body was then advanced completely out of the delivery sheath again ensuring that there was no wire wrap.  Subsequently working from both the right and left sides the main body was pulled down to seated squarely on the aortic bifurcation.  The outer yellow catheter was then removed from the contralateral wire opening the contralateral limb of the main body.  Pigtail catheter was then advanced up the main wire into the proximal infrarenal aorta.  Snare wire was then removed.  The main body deployment was then completed.  A Magic torque wire was then advanced through the pigtail catheter on the left side.  Angioplasty balloons were then selected to post dilate the stent graft, a 9 mm diameter by 4 cm length balloon was selected for the right limb and a 9 mm diameter by 4 cm length balloon was selected for the left.  Simultaneous inflations to 6 atmospheres were then performed.  The aortic portion of the main body was then dilated with both balloon simultaneously as well.  The pigtail catheter was then advanced through the 7 French sheath and positioned just above the renal arteries.  Bolus injection contrast was then performed to image the reconstruction.  Upon review of the bolus injection through the pigtail further stents were needed.  A proximal aortic extension cuff using a 25 mm diameter by 75 mm length stent graft was taken to the base of the right renal artery which was the lowest renal artery with about 4 cm of overlap.  This was then hit with the compliant balloon with excellent angiographic completion result with good seal, no endoleak, and brisk flow through the aorta and iliac arteries without significant residual stenosis.  At this point we elected to terminate the procedure. We secured the pro glide devices and the StarClose for hemostasis on the femoral arteries. The skin incision was closed with a 4-0 Monocryl. Dermabond and pressure  dressing were placed. The patient was taken to the recovery room in stable condition having tolerated the procedure well.  COMPLICATIONS: none  CONDITION: stable  Leotis Pain 04/26/2022

## 2022-04-26 NOTE — Anesthesia Preprocedure Evaluation (Addendum)
Anesthesia Evaluation  Patient identified by MRN, date of birth, ID band Patient awake    Reviewed: Allergy & Precautions, NPO status , Patient's Chart, lab work & pertinent test results  History of Anesthesia Complications Negative for: history of anesthetic complications  Airway Mallampati: III  TM Distance: <3 FB Neck ROM: full    Dental  (+) Chipped, Poor Dentition, Missing   Pulmonary neg shortness of breath, COPD, Current Smoker and Patient abstained from smoking.,    Pulmonary exam normal        Cardiovascular Exercise Tolerance: Good hypertension, (-) angina+ CAD, + Past MI, + CABG and + Peripheral Vascular Disease  (-) DOE Normal cardiovascular exam     Neuro/Psych negative neurological ROS  negative psych ROS   GI/Hepatic Neg liver ROS, hiatal hernia, GERD  Controlled,  Endo/Other  negative endocrine ROS  Renal/GU      Musculoskeletal   Abdominal   Peds  Hematology negative hematology ROS (+)   Anesthesia Other Findings Past Medical History: No date: Aortic atherosclerosis (HCC) No date: Arthritis No date: Coronary artery disease     Comment:  a.) LHC --> 100% pLAD; transferred to [facility]. b.) 1v               MIDCABG 09/07/2011 No date: Diverticulosis No date: History of hiatal hernia No date: HLD (hyperlipidemia) No date: HTN (hypertension) No date: Long term current use of antithrombotics/antiplatelets     Comment:  a.) on daily DAPT therapy (ASA + clopidogrel) 09/01/2011: Non-ST elevation MI (NSTEMI) ([place])     Comment:  a.) LHC --> 100% pLAD; transferred to [facility]. b.) 1v               MIDCABG (LIMA-LAD) 09/07/2011 via mid-LEFT thoracotomy No date: Peripheral vascular disease ([place]) 09/07/2011: S/P CABG x 1     Comment:  a.) 1v (LIMA-LAD) MIDCABG at [facility]  Past Surgical History: 09/04/2011: CARDIAC CATHETERIZATION 09/07/2011: CORONARY ARTERY BYPASS GRAFT; N/A     Comment:  Procedure: CORONARY  ARTERY BYPASS GRAFT (1v mini left               anterior thoracotomy approach; LIMA-LAD); Location: [facility];              Surgeon: Durward Mallard, MD No date: HIATAL HERNIA REPAIR 02/20/2022: LOWER EXTREMITY ANGIOGRAPHY; Right     Comment:  Procedure: Lower Extremity Angiography;  Surgeon: Algernon Huxley, MD;  Location: [facility];  Service:               Cardiovascular;  Laterality: Right;  BMI    Body Mass Index: 20.53 kg/m      Reproductive/Obstetrics negative OB ROS                             Anesthesia Physical Anesthesia Plan  ASA: 3  Anesthesia Plan: General ETT   Post-op Pain Management:    Induction: Intravenous  PONV Risk Score and Plan: Ondansetron, Dexamethasone, Midazolam and Treatment may vary due to age or medical condition  Airway Management Planned: Oral ETT  Additional Equipment:   Intra-op Plan:   Post-operative Plan: Extubation in OR  Informed Consent: I have reviewed the patients History and Physical, chart, labs and discussed the procedure including the risks, benefits and alternatives for the proposed anesthesia with the patient or authorized representative who has indicated his/her understanding  and acceptance.     Dental Advisory Given  Plan Discussed with: Anesthesiologist, CRNA and Surgeon  Anesthesia Plan Comments: (Patient consented for risks of anesthesia including but not limited to:  - adverse reactions to medications - damage to eyes, teeth, lips or other oral mucosa - nerve damage due to positioning  - sore throat or hoarseness - Damage to heart, brain, nerves, lungs, other parts of body or loss of life  Patient voiced understanding.)        Anesthesia Quick Evaluation

## 2022-04-26 NOTE — Interval H&P Note (Signed)
History and Physical Interval Note:  04/26/2022 12:45 PM  George Santos  has presented today for surgery, with the diagnosis of AAA Stent Repair   GORE     ASO w ulceration     Dr Wyn Quakerew w [facility] to assist.  The various methods of treatment have been discussed with the patient and family. After consideration of risks, benefits and other options for treatment, the patient has consented to  Procedure(s): ENDOVASCULAR REPAIR/STENT GRAFT (N/A) as a surgical intervention.  The patient's history has been reviewed, patient examined, no change in status, stable for surgery.  I have reviewed the patient's chart and labs.  Questions were answered to the patient's satisfaction.     Festus BarrenJason Dayden Viverette

## 2022-04-26 NOTE — Anesthesia Postprocedure Evaluation (Signed)
Anesthesia Post Note  Patient: George Santos  Procedure(s) Performed: ENDOVASCULAR REPAIR/STENT GRAFT  Patient location during evaluation: PACU Anesthesia Type: General Level of consciousness: awake and alert Pain management: pain level controlled Vital Signs Assessment: post-procedure vital signs reviewed and stable Respiratory status: spontaneous breathing, nonlabored ventilation and respiratory function stable Cardiovascular status: blood pressure returned to baseline and stable Postop Assessment: no apparent nausea or vomiting Anesthetic complications: no   No notable events documented.   Last Vitals:  Vitals:   04/26/22 1800 04/26/22 1815  BP: (!) 154/65 (!) 149/63  Pulse: (!) 44 (!) 45  Resp: 14 14  Temp:  36.6 C  SpO2: 100% 100%    Last Pain:  Vitals:   04/26/22 1815  TempSrc: Oral  PainSc: 0-No pain                 Foye DeerEric Mitchell Sylar Santos

## 2022-04-26 NOTE — Op Note (Signed)
OPERATIVE NOTE   PROCEDURE: US guidance for vascular access, bilateral femoral arteries Catheter placement into aorta from bilateral femoral approaches Placement of a 22 mm proximal diameter by 60 mm aortic length by 13 mm x 40 mm iliac limbs. Endologix aortic endoprosthesis main body  Placement of an aortic endograft extender with an Endologix 25 mm x 75 mm length proximal cuff ProGlide closure devices right femoral artery with a Star close device left common femoral artery.  PRE-OPERATIVE DIAGNOSIS: Atherosclerotic occlusive disease bilateral lower extremities with lifestyle limiting claudication and mild rest pain  POST-OPERATIVE DIAGNOSIS: same  SURGEON: Levora Dredge, MD and Festus Barren, MD - Co-surgeons  ANESTHESIA: general  ESTIMATED BLOOD LOSS: 10 cc  CONTRAST: 50 cc.  FLUOROSCOPY TIME: 9.3 minutes  FINDING(S): 1.  Critical distal aortic atherosclerotic occlusive disease associated with bilateral common iliac artery occlusive disease  SPECIMEN(S):  none  INDICATIONS:   George Santos is a 66 y.o. y.o. male who presents with lifestyle limiting claudication and mild rest pain.  Work-up demonstrates the patient is a suitable candidate for aortobiiliac stenting using the Endologix stent graft.  The risks and benefits have been reviewed with the patient alternative therapies including open aortobifemoral bypass was discussed.  All questions have been answered.  The patient is voicing severe lifestyle limitation and therefore wishes to proceed with endograft repair.  DESCRIPTION: After obtaining full informed written consent, the patient was brought back to the operating room and placed supine upon the operating table.  The patient received IV antibiotics prior to induction.  After obtaining adequate anesthesia, the patient was prepped and draped in the standard fashion for endovascular aortoiliac stent graft placement.  Co-surgeons are required because this is a complex  bilateral procedure with work being performed simultaneously from both the right femoral and left femoral approach.  This also expedites the procedure making a shorter operative time reducing complications and improving patient safety.  We then began by gaining access to both femoral arteries with US guidance with me working on the patient's left and Dr. Wyn Quaker working on the patient's right.  The femoral arteries were found to be patent and accessed without difficulty with a needle under ultrasound guidance without difficulty on each side and permanent images were recorded.  We then placed 2 proglide devices on the right side in a pre-close fashion followed by placement of an 8 French sheath.  The left side was then accessed with ultrasound as described above and a J-wire was advanced without difficulty.  A 7 French sheath was placed  The patient was then given 5000 units of intravenous heparin.   The Pigtail catheter was placed into the aorta from the left side. Using this image, we selected a 22-60 x 13-40 Endologix device device.  A Lunderquist wire was then advanced up the right side.  The delivery sheath was advanced over a stiff wire and position with the tip of the sheath just above the aortic bifurcation.   The 22-60 x 13-40 Endologix main body was then placed into the delivery sheath and the contralateral snare wire advanced through the delivery sheath and positioned at the tip of the sheath.  Working from the left side a trilobed snare was advanced through the 7 French sheath and positioned several centimeters above the tip of the delivery sheath.  The contralateral snare wire was then advanced through the snare and the snare used to capture the contralateral wire.  The wire was then fed from the  ipsilateral side while it was pulled out the 7 [place] sheath.  Taking care and making adjustments to ensure there was no wire wrap.  The Endologix main body was then advanced completely out of the delivery  sheath again ensuring that there was no wire wrap.  Subsequently working from both the right and left sides the main body was pulled down to seated squarely on the aortic bifurcation.  The outer yellow catheter was then removed from the contralateral wire opening the contralateral limb of the main body.  Pigtail catheter was then advanced up the main wire into the proximal infrarenal aorta.  Snare wire was then removed.  The main body deployment was then completed.  A Magic torque wire was then advanced through the pigtail catheter on the left side.  Angioplasty balloons were then selected to post dilate the stent graft, a 9 x 40 mm balloon was selected for the right limb and a 9 x 40 mm balloon was selected for the left.  Simultaneous inflations to 10 atmospheres were then performed.  The aortic portion of the main body was then dilated with both balloon simultaneously as well.  The pigtail catheter was then advanced through the 7 French sheath and positioned just above the renal arteries.  Bolus injection contrast was then performed to image the reconstruction.  Upon review of the bolus injection through the pigtail further stents were needed.  A 25-25 x 75 proximal aortic cuff was selected and advanced up the right side.  Pigtail catheter was positioned from the left side just above the renals and magnified imaging of the renal arteries was obtained.  The proximal aortic cuff was then deployed after a second image of the renal arteries was obtained to ensure proper position.  A compliant balloon was then used to seal the proximal cuff to the main body.  Prior to inflating the compliant balloon the pigtail catheter was pulled back into the left iliac and then readvanced to just above the proximal stent.  Bolus injection contrast was then performed which demonstrated excellent seal across the stent graft.  There was less than 10% residual stenosis throughout the entire aorta iliac system.  Leading edge of the  stent graft is located approximately 1 to 2 mm below the right renal artery which was the lowest renal artery.  At this point we elected to terminate the procedure. We secured the pro glide devices and the StarClose for hemostasis on the femoral arteries. The skin incision was closed with a 4-0 Monocryl. Dermabond and pressure dressing were placed. The patient was taken to the recovery room in stable condition having tolerated the procedure well.  COMPLICATIONS: none  CONDITION: stable  Levora DredgeGregory Eara Burruel  04/26/2022, 5:10 PM

## 2022-04-26 NOTE — Progress Notes (Signed)
MD dew informed critical ptt came back as greater than 200, per md no new orders at this time since he was given a heparin bolus. Will continue to monitor.

## 2022-04-27 ENCOUNTER — Encounter: Payer: Self-pay | Admitting: Vascular Surgery

## 2022-04-27 LAB — BASIC METABOLIC PANEL
Anion gap: 6 (ref 5–15)
BUN: 8 mg/dL (ref 8–23)
CO2: 26 mmol/L (ref 22–32)
Calcium: 8.3 mg/dL — ABNORMAL LOW (ref 8.9–10.3)
Chloride: 107 mmol/L (ref 98–111)
Creatinine, Ser: 1.02 mg/dL (ref 0.61–1.24)
GFR, Estimated: >60 mL/min (ref >60)
Glucose, Bld: 153 mg/dL — ABNORMAL HIGH (ref 70–99)
Potassium: 4.6 mmol/L (ref 3.5–5.1)
Sodium: 139 mmol/L (ref 135–145)

## 2022-04-27 LAB — CBC
HCT: 32.1 % — ABNORMAL LOW (ref 39.0–52.0)
Hemoglobin: 10 g/dL — ABNORMAL LOW (ref 13.0–17.0)
MCH: 27.9 pg (ref 26.0–34.0)
MCHC: 31.2 g/dL (ref 30.0–36.0)
MCV: 89.4 fL (ref 80.0–100.0)
Platelets: 461 10*3/uL — ABNORMAL HIGH (ref 150–400)
RBC: 3.59 MIL/uL — ABNORMAL LOW (ref 4.22–5.81)
RDW: 13.3 % (ref 11.5–15.5)
WBC: 13.5 10*3/uL — ABNORMAL HIGH (ref 4.0–10.5)
nRBC: 0 % (ref 0.0–0.2)

## 2022-04-27 MED ORDER — OXYCODONE-ACETAMINOPHEN 5-325 MG PO TABS: 1.0000 | ORAL_TABLET | ORAL | 0 refills | Status: DC | PRN

## 2022-04-27 MED ORDER — FAMOTIDINE 20 MG PO TABS
20.0000 mg | ORAL_TABLET | Freq: Two times a day (BID) | ORAL | Status: DC
  Administered 2022-04-27: 20 mg via ORAL
  Filled 2022-04-27: qty 1

## 2022-04-27 NOTE — Discharge Summary (Signed)
[facility]    Discharge Summary    Patient ID:  George Santos MRN: [id number] DOB/AGE: January 16, 1956 66 y.o.  Admit date: 04/26/2022 Discharge date: 04/27/2022 Date of Surgery: 04/26/2022 Surgeon: Surgeon(s): Dew, Marlow Baars, MD Schnier, Latina Craver, MD  Admission Diagnosis: Atherosclerosis of native artery of both lower extremities with intermittent claudication [facility]) [I70.213]  Discharge Diagnoses:  Atherosclerosis of native artery of both lower extremities with intermittent claudication [facility]) [I70.213]  Secondary Diagnoses: Past Medical History:  Diagnosis Date   Aortic atherosclerosis (HCC)    Arthritis    Coronary artery disease    a.) LHC --> 100% pLAD; transferred to [facility]. b.) 1v MIDCABG 09/07/2011   Diverticulosis    History of hiatal hernia    HLD (hyperlipidemia)    HTN (hypertension)    Long term current use of antithrombotics/antiplatelets    a.) on daily DAPT therapy (ASA + clopidogrel)   Non-ST elevation MI (NSTEMI) (HCC) 09/01/2011   a.) LHC --> 100% pLAD; transferred to [facility]. b.) 1v MIDCABG (LIMA-LAD) 09/07/2011 via mid-LEFT thoracotomy   Peripheral vascular disease (HCC)    S/P CABG x 1 09/07/2011   a.) 1v (LIMA-LAD) MIDCABG at [facility]    Procedure(s): ENDOVASCULAR REPAIR/STENT GRAFT  Discharged Condition: good  HPI:  The patient underwent procedure on 04/26/2022, including:   PROCEDURE: US guidance for vascular access, bilateral femoral arteries Catheter placement into aorta from bilateral femoral approaches Placement of a 22 mm proximal 60 mm proximal length with 13 mm x 40 mm iliac limbs Endologix aortic endoprosthesis main body  Placement of an aorta endograft extender with an Endologix 25 mm diameter by 75 mm length proximal extension limb ProGlide closure devices right femoral artery with a Star close device left common femoral artery.  The patient has done well and denies any complaints.    Hospital Course:  George Santos is a 66 y.o. male is S/P Endovascular Stent Graft Procedure(s): ENDOVASCULAR REPAIR/STENT GRAFT Extubated: POD # 0 Physical exam: Clean, Dry and Intact wound, legs warm bilaterally  Post-op wounds clean, dry, intact or healing well Pt. Ambulating, voiding and taking PO diet without difficulty. Pt pain controlled with PO pain meds. Labs as below Complications:none  Consults:    Significant Diagnostic Studies: CBC Lab Results  Component Value Date   WBC 13.5 (H) 04/27/2022   HGB 10.0 (L) 04/27/2022   HCT 32.1 (L) 04/27/2022   MCV 89.4 04/27/2022   PLT 461 (H) 04/27/2022    BMET    Component Value Date/Time   NA 139 04/27/2022 0423   NA 135 (L) 07/12/2013 1755   K 4.6 04/27/2022 0423   K 4.0 07/12/2013 1755   CL 107 04/27/2022 0423   CL 105 07/12/2013 1755   CO2 26 04/27/2022 0423   CO2 20 (L) 07/12/2013 1755   GLUCOSE 153 (H) 04/27/2022 0423   GLUCOSE 80 07/12/2013 1755   BUN 8 04/27/2022 0423   BUN 13 07/12/2013 1755   CREATININE 1.02 04/27/2022 0423   CREATININE 1.12 07/12/2013 1755   CALCIUM 8.3 (L) 04/27/2022 0423   CALCIUM 8.5 07/12/2013 1755   GFRNONAA >60 04/27/2022 0423   GFRNONAA >60 07/12/2013 1755   GFRAA >60 07/12/2013 1755   COAG Lab Results  Component Value Date   INR 1.2 04/26/2022   INR 1.0 04/06/2022     Disposition:  Discharge to :Home  Allergies as of 04/27/2022   No Known Allergies      Medication List  TAKE these medications    acetaminophen 325 MG tablet Commonly known as: TYLENOL Take 650 mg by mouth every 6 (six) hours as needed.   aspirin EC 81 MG tablet Take 81 mg by mouth daily. Swallow whole.   atorvastatin 10 MG tablet Commonly known as: Lipitor Take 1 tablet (10 mg total) by mouth daily.   clopidogrel 75 MG tablet Commonly known as: Plavix Take 1 tablet (75 mg total) by mouth daily.   ibuprofen 200 MG tablet Commonly known as: ADVIL Take 200 mg by mouth every 6 (six) hours as needed.    oxyCODONE-acetaminophen 5-325 MG tablet Commonly known as: PERCOCET/ROXICET Take 1-2 tablets by mouth every 4 (four) hours as needed for moderate pain.       Verbal and written Discharge instructions given to the patient. Wound care per Discharge AVS  Follow-up Information     Dew, Marlow BaarsJason S, MD Follow up in 4 week(s).   Specialties: Vascular Surgery, Radiology, Interventional Cardiology Why: Aorta Illiac Duplex in 4 weeks with JD/FB Contact information: [address] [place] [address] [phone]                 Signed: Georgiana SpinnerFallon E Resean Brander, NP  04/27/2022, 12:23 PM

## 2022-04-27 NOTE — Progress Notes (Signed)
PHARMACIST - PHYSICIAN COMMUNICATION  CONCERNING: IV to Oral Route Change Policy  RECOMMENDATION: This patient is receiving famotidine by the intravenous route.  Based on criteria approved by the Pharmacy and Therapeutics Committee, the intravenous medication(s) is/are being converted to the equivalent oral dose form(s).   DESCRIPTION: These criteria include: The patient is eating (either orally or via tube) and/or has been taking other orally administered medications for a least 24 hours The patient has no evidence of active gastrointestinal bleeding or impaired GI absorption (gastrectomy, short bowel, patient on TNA or NPO).  If you have questions about this conversion, please contact the [place] 04/27/2022 8:13 AM

## 2022-04-27 NOTE — Plan of Care (Signed)

## 2022-04-27 NOTE — Progress Notes (Signed)
Pt getting discharged, all lines removed, questions and concerns addressed and answered. Pt Aox4, RA, SB (his normal), pulses in all extremities, femoral sites clean, dry and intact, Urinated after foley removed. Will continue to monitor until pt taken out of hospital

## 2022-05-18 ENCOUNTER — Ambulatory Visit: Payer: Medicare Other | Admitting: Gastroenterology

## 2023-11-22 IMAGING — CT CT CTA ABD/PEL W/CM AND/OR W/O CM
3 of 10 series · 10 of 46 positions shown, 16 images · IV contrast (agent unspecified)
Comparison: None Available.

CLINICAL DATA: Lower GI bleed

Bright red blood per rectum
EXAM:
CTA ABDOMEN AND PELVIS WITHOUT AND WITH CONTRAST
TECHNIQUE: Multidetector CT imaging of the abdomen and pelvis was performed
using the standard protocol during bolus administration of
intravenous contrast. Multiplanar reconstructed images and MIPs were
obtained and reviewed to evaluate the vascular anatomy.

[Series 5: axial arterial · axial · arterial · 0.70mm/px · z∈[-1101,-937]mm · 4 of 230 slices shown]
[im 17/230  soft-tissue]
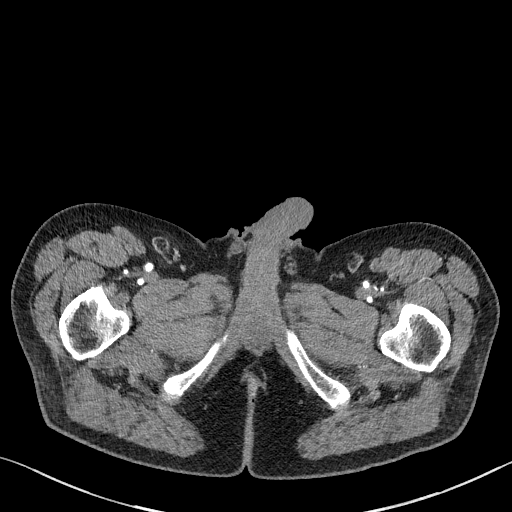
[im 50/230  soft-tissue]
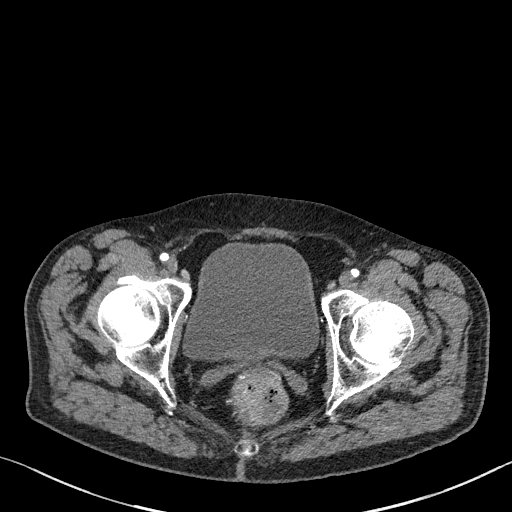
[im 82/230  soft-tissue]
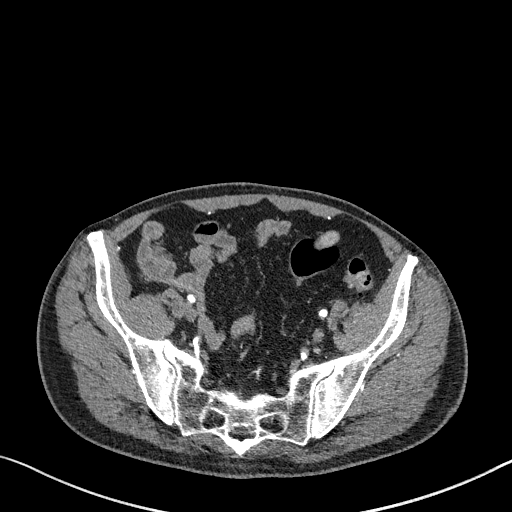
[im 99/230  soft-tissue]
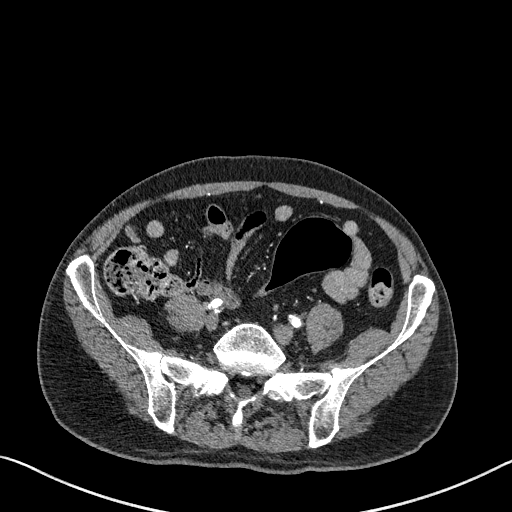

[Series 6: axial venous · axial · portal-venous · 0.70mm/px · z∈[-1040,-770]mm · 4 of 92 slices shown, 9 images]
[im 19/92  soft-tissue]
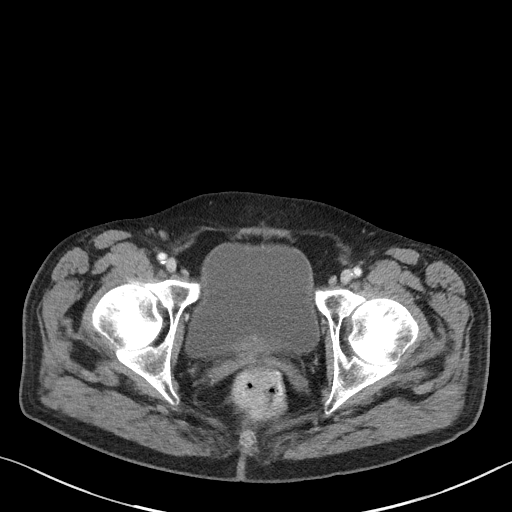
[im 19/92  lung]
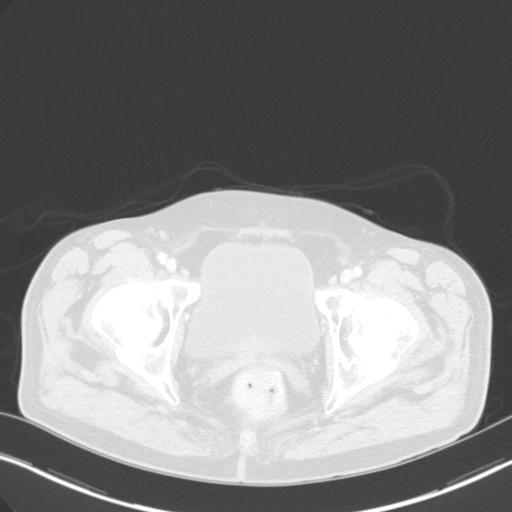
[im 19/92  bone]
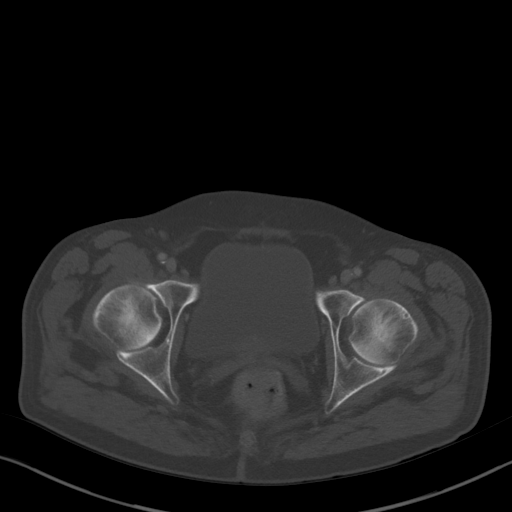
[im 37/92  soft-tissue]
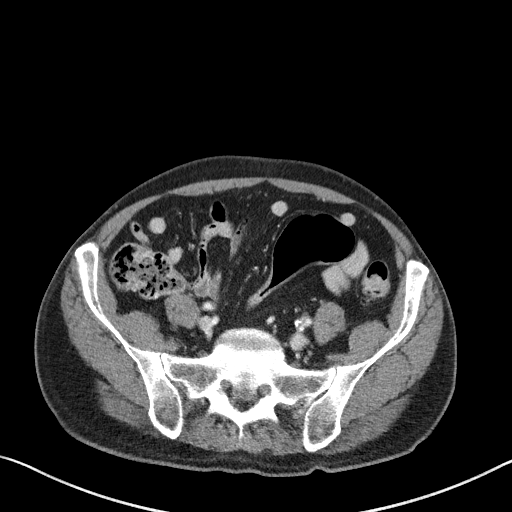
[im 37/92  lung]
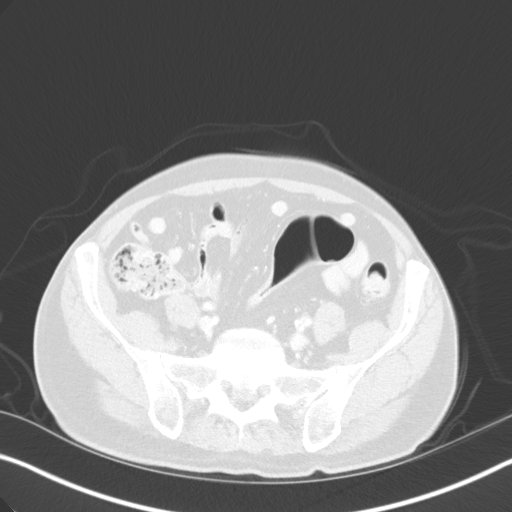
[im 55/92  soft-tissue]
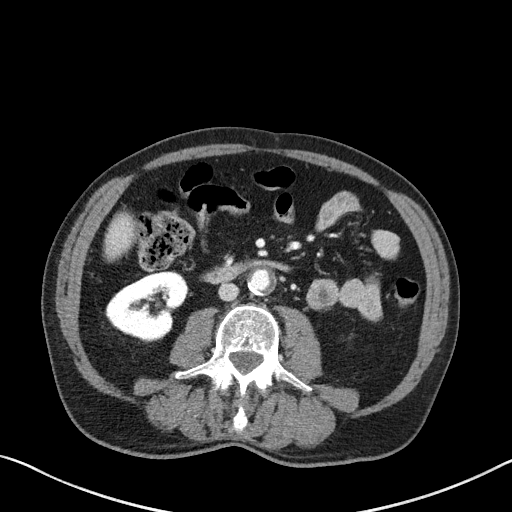
[im 55/92  lung]
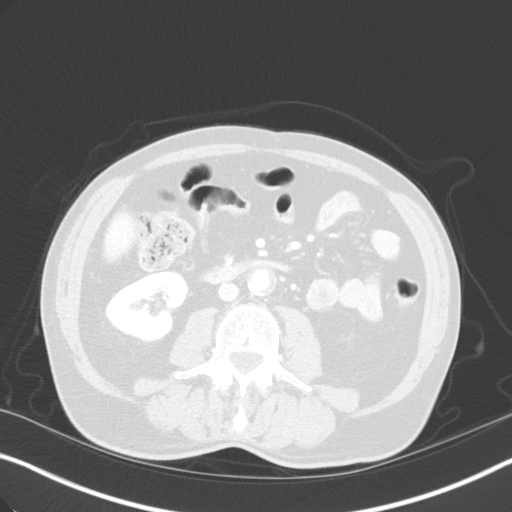
[im 73/92  soft-tissue]
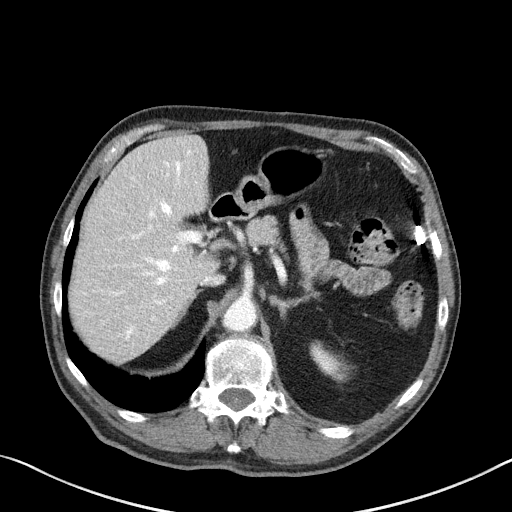
[im 73/92  lung]
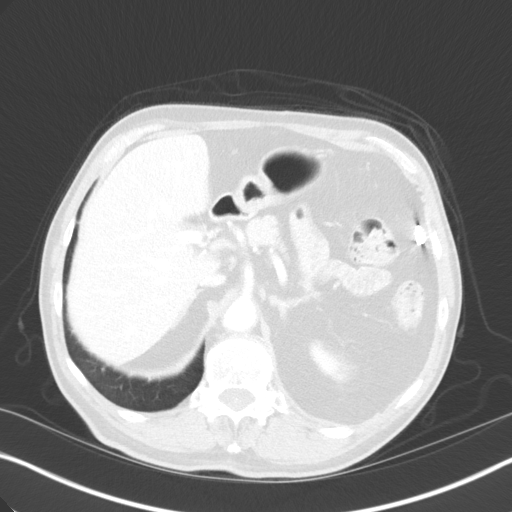

[Series 9: coronal mpr · coronal · 0.68mm/px · 2 of 136 slices shown, 3 images]
[im 46/136  soft-tissue]
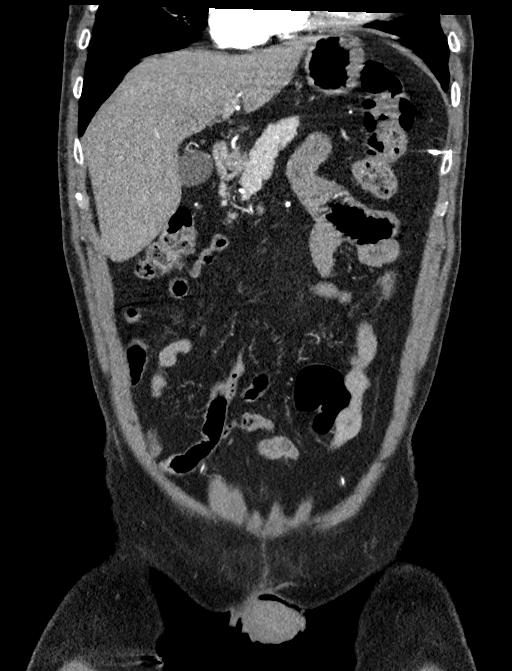
[im 46/136  bone]
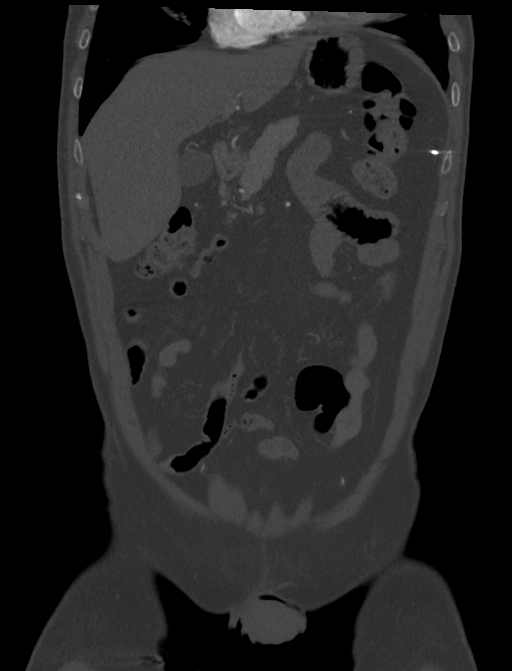
[im 91/136  soft-tissue]
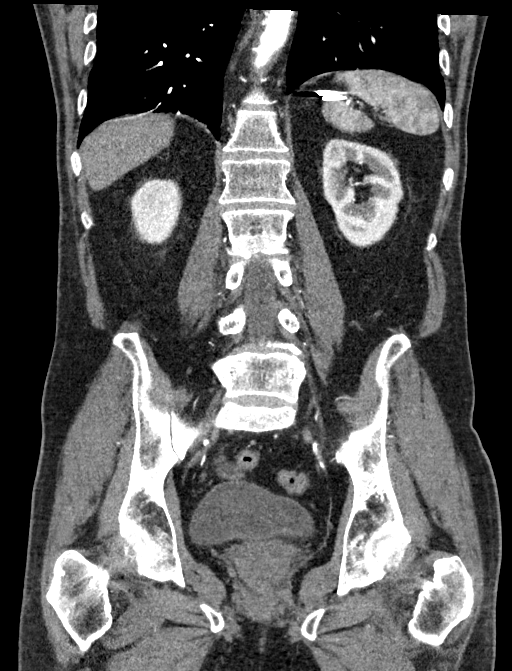

[10 of 46 positions shown; findings below may reference images not displayed]

RADIATION DOSE REDUCTION: This exam was performed according to the
departmental dose-optimization program which includes automated
exposure control, adjustment of the mA and/or kV according to
patient size and/or use of iterative reconstruction technique.

CONTRAST:  100mL OMNIPAQUE IOHEXOL 350 MG/ML SOLN
FINDINGS: VASCULAR

Aorta: Predominately noncalcified plaque seen throughout the
infrarenal abdominal aorta without flow-limiting stenosis or
aneurysm.

Celiac: Normal.

SMA: Normal.

Renals: No significant abnormality of the main renal arteries
bilaterally.

IMA: Occluded at the origin but opacifies distally indicative of
retrograde collateral flow.

Inflow: Moderate stenosis at the origin of the right common iliac
artery due to predominantly noncalcified plaque. No significant
narrowing of the right internal iliac artery. Right external iliac
artery is patent without significant narrowing.

Irregular predominately noncalcified plaque seen throughout the left
common iliac artery resulting in moderate to severe stenosis of the
proximal segment. Left internal iliac artery is patent. No
significant stenosis of the left external iliac artery.

Proximal Outflow: Bilateral common femoral and visualized portions
of the superficial and profunda femoral arteries are patent without
evidence of aneurysm, dissection, vasculitis or significant
stenosis.

Veins: Hepatic, portal, superior mesenteric veins are patent.

Review of the MIP images confirms the above findings.

NON-VASCULAR

Lower chest: The left hemidiaphragm is elevated. Respiratory motion
limits evaluation of lung bases. Mild atelectasis is/scarring noted
in the right middle lobe.

Hepatobiliary: Nonspecific 4 mm hyperenhancing structure seen in the
right hepatic lobe (38/5). This is most likely a flash filling
hemangioma, however the exact etiology is difficult to establish due
to its small size. Liver, gallbladder common bile duct otherwise
unremarkable.

Pancreas: Unremarkable. No pancreatic ductal dilatation or
surrounding inflammatory changes.

Spleen: Normal in size without focal abnormality.

Adrenals/Urinary Tract: Adrenal glands are unremarkable. Kidneys are
normal, without renal calculi, focal lesion, or hydronephrosis.
Bladder is unremarkable.

Stomach/Bowel: No bowel dilatation to indicate ileus or obstruction.
The appendix is not definitively identified.

No active extravasation and seen within the bowel.

Mild diverticulosis of the descending and sigmoid colon without
evidence of acute diverticulitis.

Lymphatic: No enlarged abdominal or pelvic lymph nodes.

Reproductive: Prostate is unremarkable.

Other: Multiple surgical clips seen in the upper abdomen.

Musculoskeletal: No acute or significant osseous
findings.Curvilinear sclerosis of the right femoral head likely
related to avascular necrosis/bony infarct.
IMPRESSION: VASCULAR

No active extravasation identified within the gastrointestinal
tract.

NON-VASCULAR

Mild distal colonic diverticulosis.

## 2024-02-18 ENCOUNTER — Encounter: Payer: Self-pay | Admitting: Family Medicine

## 2024-02-18 ENCOUNTER — Observation Stay
Admission: EM | Admit: 2024-02-18 | Discharge: 2024-02-20 | Disposition: A | Discharge status: Home / Self Care | Attending: Internal Medicine | Admitting: Internal Medicine

## 2024-02-18 ENCOUNTER — Other Ambulatory Visit: Payer: Self-pay

## 2024-02-18 ENCOUNTER — Encounter
Admission: EM | Disposition: A | Discharge status: Home / Self Care | Source: Home / Self Care | Attending: Student in an Organized Health Care Education/Training Program

## 2024-02-18 DIAGNOSIS — Z95820 Peripheral vascular angioplasty status with implants and grafts: Secondary | ICD-10-CM | POA: Diagnosis not present

## 2024-02-18 DIAGNOSIS — I739 Peripheral vascular disease, unspecified: Secondary | ICD-10-CM

## 2024-02-18 DIAGNOSIS — I10 Essential (primary) hypertension: Secondary | ICD-10-CM | POA: Insufficient documentation

## 2024-02-18 DIAGNOSIS — I2510 Atherosclerotic heart disease of native coronary artery without angina pectoris: Secondary | ICD-10-CM

## 2024-02-18 DIAGNOSIS — F17210 Nicotine dependence, cigarettes, uncomplicated: Secondary | ICD-10-CM | POA: Diagnosis not present

## 2024-02-18 DIAGNOSIS — Z951 Presence of aortocoronary bypass graft: Secondary | ICD-10-CM | POA: Diagnosis not present

## 2024-02-18 DIAGNOSIS — I745 Embolism and thrombosis of iliac artery: Secondary | ICD-10-CM

## 2024-02-18 DIAGNOSIS — I998 Other disorder of circulatory system: Secondary | ICD-10-CM

## 2024-02-18 DIAGNOSIS — R0989 Other specified symptoms and signs involving the circulatory and respiratory systems: Secondary | ICD-10-CM | POA: Diagnosis not present

## 2024-02-18 DIAGNOSIS — M6281 Muscle weakness (generalized): Secondary | ICD-10-CM | POA: Insufficient documentation

## 2024-02-18 DIAGNOSIS — F17200 Nicotine dependence, unspecified, uncomplicated: Secondary | ICD-10-CM | POA: Diagnosis present

## 2024-02-18 DIAGNOSIS — M79604 Pain in right leg: Principal | ICD-10-CM

## 2024-02-18 DIAGNOSIS — I252 Old myocardial infarction: Secondary | ICD-10-CM | POA: Diagnosis not present

## 2024-02-18 DIAGNOSIS — I70221 Atherosclerosis of native arteries of extremities with rest pain, right leg: Secondary | ICD-10-CM | POA: Diagnosis present

## 2024-02-18 DIAGNOSIS — Z7982 Long term (current) use of aspirin: Secondary | ICD-10-CM | POA: Insufficient documentation

## 2024-02-18 DIAGNOSIS — T82868A Thrombosis of vascular prosthetic devices, implants and grafts, initial encounter: Secondary | ICD-10-CM | POA: Diagnosis not present

## 2024-02-18 DIAGNOSIS — Z9889 Other specified postprocedural states: Secondary | ICD-10-CM | POA: Diagnosis not present

## 2024-02-18 DIAGNOSIS — I743 Embolism and thrombosis of arteries of the lower extremities: Secondary | ICD-10-CM

## 2024-02-18 DIAGNOSIS — C20 Malignant neoplasm of rectum: Secondary | ICD-10-CM | POA: Diagnosis not present

## 2024-02-18 DIAGNOSIS — I70213 Atherosclerosis of native arteries of extremities with intermittent claudication, bilateral legs: Secondary | ICD-10-CM | POA: Diagnosis present

## 2024-02-18 DIAGNOSIS — Y832 Surgical operation with anastomosis, bypass or graft as the cause of abnormal reaction of the patient, or of later complication, without mention of misadventure at the time of the procedure: Secondary | ICD-10-CM | POA: Insufficient documentation

## 2024-02-18 DIAGNOSIS — R2689 Other abnormalities of gait and mobility: Secondary | ICD-10-CM | POA: Diagnosis not present

## 2024-02-18 DIAGNOSIS — Z7902 Long term (current) use of antithrombotics/antiplatelets: Secondary | ICD-10-CM | POA: Insufficient documentation

## 2024-02-18 DIAGNOSIS — Z79899 Other long term (current) drug therapy: Secondary | ICD-10-CM | POA: Insufficient documentation

## 2024-02-18 DIAGNOSIS — R197 Diarrhea, unspecified: Secondary | ICD-10-CM

## 2024-02-18 DIAGNOSIS — R239 Unspecified skin changes: Secondary | ICD-10-CM

## 2024-02-18 HISTORY — PX: LOWER EXTREMITY ANGIOGRAPHY: CATH118251

## 2024-02-18 LAB — COMPREHENSIVE METABOLIC PANEL
ALT: 15 U/L (ref 0–44)
AST: 18 U/L (ref 15–41)
Albumin: 3.4 g/dL — ABNORMAL LOW (ref 3.5–5.0)
Alkaline Phosphatase: 41 U/L (ref 38–126)
Anion gap: 14 (ref 5–15)
BUN: 23 mg/dL (ref 8–23)
CO2: 19 mmol/L — ABNORMAL LOW (ref 22–32)
Calcium: 8.4 mg/dL — ABNORMAL LOW (ref 8.9–10.3)
Chloride: 100 mmol/L (ref 98–111)
Creatinine, Ser: 1.06 mg/dL (ref 0.61–1.24)
GFR, Estimated: >60 mL/min (ref >60)
Glucose, Bld: 179 mg/dL — ABNORMAL HIGH (ref 70–99)
Potassium: 3.7 mmol/L (ref 3.5–5.1)
Sodium: 133 mmol/L — ABNORMAL LOW (ref 135–145)
Total Bilirubin: 0.8 mg/dL (ref 0.0–1.2)
Total Protein: 6.5 g/dL (ref 6.5–8.1)

## 2024-02-18 LAB — CBC WITH DIFFERENTIAL/PLATELET
Abs Immature Granulocytes: 0.03 10*3/uL (ref 0.00–0.07)
Basophils Absolute: 0 10*3/uL (ref 0.0–0.1)
Basophils Relative: 0 %
Eosinophils Absolute: 0 10*3/uL (ref 0.0–0.5)
Eosinophils Relative: 1 %
HCT: 39.7 % (ref 39.0–52.0)
Hemoglobin: 12.4 g/dL — ABNORMAL LOW (ref 13.0–17.0)
Immature Granulocytes: 1 %
Lymphocytes Relative: 4 %
Lymphs Abs: 0.2 10*3/uL — ABNORMAL LOW (ref 0.7–4.0)
MCH: 24.9 pg — ABNORMAL LOW (ref 26.0–34.0)
MCHC: 31.2 g/dL (ref 30.0–36.0)
MCV: 79.9 fL — ABNORMAL LOW (ref 80.0–100.0)
Monocytes Absolute: 0.9 10*3/uL (ref 0.1–1.0)
Monocytes Relative: 18 %
Neutro Abs: 3.8 10*3/uL (ref 1.7–7.7)
Neutrophils Relative %: 76 %
Platelets: 230 10*3/uL (ref 150–400)
RBC: 4.97 MIL/uL (ref 4.22–5.81)
RDW: 23.9 % — ABNORMAL HIGH (ref 11.5–15.5)
Smear Review: NORMAL
WBC: 4.9 10*3/uL (ref 4.0–10.5)
nRBC: 0 % (ref 0.0–0.2)

## 2024-02-18 LAB — PROTIME-INR
INR: 1.3 — ABNORMAL HIGH (ref 0.8–1.2)
Prothrombin Time: 16.3 s — ABNORMAL HIGH (ref 11.4–15.2)

## 2024-02-18 LAB — APTT: aPTT: 142 s — ABNORMAL HIGH (ref 24–36)

## 2024-02-18 SURGERY — LOWER EXTREMITY ANGIOGRAPHY
Anesthesia: Moderate Sedation | Laterality: Right

## 2024-02-18 MED ORDER — FENTANYL CITRATE (PF) 100 MCG/2ML IJ SOLN
INTRAMUSCULAR | Status: DC | PRN
  Administered 2024-02-18 (×3): 25 ug via INTRAVENOUS
  Administered 2024-02-18: 50 ug via INTRAVENOUS

## 2024-02-18 MED ORDER — DIPHENHYDRAMINE HCL 50 MG/ML IJ SOLN: 50.0000 mg | Freq: Once | INTRAMUSCULAR | Status: DC | PRN

## 2024-02-18 MED ORDER — FENTANYL CITRATE PF 50 MCG/ML IJ SOSY: 12.5000 ug | PREFILLED_SYRINGE | Freq: Once | INTRAMUSCULAR | Status: DC | PRN

## 2024-02-18 MED ORDER — HEPARIN BOLUS VIA INFUSION
3400.0000 [IU] | Freq: Once | INTRAVENOUS | Status: AC
  Administered 2024-02-18: 3400 [IU] via INTRAVENOUS
  Filled 2024-02-18: qty 3400

## 2024-02-18 MED ORDER — HEPARIN (PORCINE) IN NACL 2000-0.9 UNIT/L-% IV SOLN
INTRAVENOUS | Status: DC | PRN
  Administered 2024-02-18: 1000 mL

## 2024-02-18 MED ORDER — LIDOCAINE-EPINEPHRINE (PF) 1 %-1:200000 IJ SOLN
INTRAMUSCULAR | Status: DC | PRN
  Administered 2024-02-18: 10 mL

## 2024-02-18 MED ORDER — ONDANSETRON HCL 4 MG/2ML IJ SOLN
4.0000 mg | Freq: Once | INTRAMUSCULAR | Status: AC
  Administered 2024-02-18: 4 mg via INTRAVENOUS
  Filled 2024-02-18: qty 2

## 2024-02-18 MED ORDER — ATORVASTATIN CALCIUM 10 MG PO TABS
10.0000 mg | ORAL_TABLET | Freq: Every day | ORAL | Status: DC
  Administered 2024-02-18 – 2024-02-20 (×3): 10 mg via ORAL
  Filled 2024-02-18 (×3): qty 1

## 2024-02-18 MED ORDER — MIDAZOLAM HCL 2 MG/2ML IJ SOLN
INTRAMUSCULAR | Status: DC | PRN
  Administered 2024-02-18: 2 mg via INTRAVENOUS
  Administered 2024-02-18 (×2): .5 mg via INTRAVENOUS
  Administered 2024-02-18: 1 mg via INTRAVENOUS

## 2024-02-18 MED ORDER — MIDAZOLAM HCL 2 MG/ML PO SYRP: 8.0000 mg | ORAL_SOLUTION | Freq: Once | ORAL | Status: DC | PRN

## 2024-02-18 MED ORDER — FENTANYL CITRATE PF 50 MCG/ML IJ SOSY
PREFILLED_SYRINGE | INTRAMUSCULAR | Status: AC
  Filled 2024-02-18: qty 1

## 2024-02-18 MED ORDER — MIDAZOLAM HCL 2 MG/2ML IJ SOLN
INTRAMUSCULAR | Status: AC
  Filled 2024-02-18: qty 2

## 2024-02-18 MED ORDER — ACETAMINOPHEN 325 MG PO TABS: 650.0000 mg | ORAL_TABLET | Freq: Four times a day (QID) | ORAL | Status: DC | PRN

## 2024-02-18 MED ORDER — SODIUM CHLORIDE 0.9 % IV SOLN: INTRAVENOUS | Status: DC

## 2024-02-18 MED ORDER — FENTANYL CITRATE (PF) 100 MCG/2ML IJ SOLN
INTRAMUSCULAR | Status: AC
  Filled 2024-02-18: qty 2

## 2024-02-18 MED ORDER — IODIXANOL 320 MG/ML IV SOLN
INTRAVENOUS | Status: DC | PRN
  Administered 2024-02-18: 70 mL

## 2024-02-18 MED ORDER — ONDANSETRON HCL 4 MG/2ML IJ SOLN: 4.0000 mg | Freq: Four times a day (QID) | INTRAMUSCULAR | Status: DC | PRN

## 2024-02-18 MED ORDER — TIROFIBAN (AGGRASTAT) BOLUS VIA INFUSION
25.0000 ug/kg | Freq: Once | INTRAVENOUS | Status: AC
  Administered 2024-02-18: 1430 ug via INTRAVENOUS
  Filled 2024-02-18: qty 29

## 2024-02-18 MED ORDER — METHYLPREDNISOLONE SODIUM SUCC 125 MG IJ SOLR: 125.0000 mg | Freq: Once | INTRAMUSCULAR | Status: DC | PRN

## 2024-02-18 MED ORDER — TIROFIBAN HCL IN NACL 5-0.9 MG/100ML-% IV SOLN
INTRAVENOUS | Status: AC
  Filled 2024-02-18: qty 100

## 2024-02-18 MED ORDER — HYDROMORPHONE HCL 1 MG/ML IJ SOLN
1.0000 mg | INTRAMUSCULAR | Status: DC | PRN
  Administered 2024-02-18: 1 mg via INTRAVENOUS
  Filled 2024-02-18: qty 1

## 2024-02-18 MED ORDER — OXYCODONE-ACETAMINOPHEN 5-325 MG PO TABS
1.0000 | ORAL_TABLET | ORAL | Status: DC | PRN
  Administered 2024-02-19 – 2024-02-20 (×2): 2 via ORAL
  Filled 2024-02-18 (×2): qty 2

## 2024-02-18 MED ORDER — CEFAZOLIN SODIUM-DEXTROSE 2-4 GM/100ML-% IV SOLN
2.0000 g | INTRAVENOUS | Status: AC
  Administered 2024-02-18: 2 g via INTRAVENOUS

## 2024-02-18 MED ORDER — NITROGLYCERIN 1 MG/10 ML FOR IR/CATH LAB
INTRA_ARTERIAL | Status: DC | PRN
  Administered 2024-02-18: 300 ug via INTRA_ARTERIAL

## 2024-02-18 MED ORDER — TIROFIBAN HCL IN NACL 5-0.9 MG/100ML-% IV SOLN
0.1500 ug/kg/min | INTRAVENOUS | Status: DC
  Administered 2024-02-18 – 2024-02-19 (×3): 0.15 ug/kg/min via INTRAVENOUS
  Filled 2024-02-18 (×2): qty 100

## 2024-02-18 MED ORDER — MIDAZOLAM HCL 2 MG/2ML IJ SOLN
INTRAMUSCULAR | Status: AC
  Filled 2024-02-18: qty 4

## 2024-02-18 MED ORDER — HEPARIN SODIUM (PORCINE) 1000 UNIT/ML IJ SOLN
INTRAMUSCULAR | Status: AC
  Filled 2024-02-18: qty 10

## 2024-02-18 MED ORDER — HEPARIN SODIUM (PORCINE) 1000 UNIT/ML IJ SOLN
INTRAMUSCULAR | Status: DC | PRN
  Administered 2024-02-18: 4000 [IU] via INTRAVENOUS

## 2024-02-18 MED ORDER — ONDANSETRON HCL 4 MG PO TABS: 4.0000 mg | ORAL_TABLET | Freq: Four times a day (QID) | ORAL | Status: DC | PRN

## 2024-02-18 MED ORDER — MORPHINE SULFATE (PF) 4 MG/ML IV SOLN
4.0000 mg | INTRAVENOUS | Status: DC | PRN
  Administered 2024-02-18: 4 mg via INTRAVENOUS
  Filled 2024-02-18: qty 1

## 2024-02-18 MED ORDER — FAMOTIDINE 20 MG PO TABS: 40.0000 mg | ORAL_TABLET | Freq: Once | ORAL | Status: DC | PRN

## 2024-02-18 MED ORDER — HEPARIN (PORCINE) 25000 UT/250ML-% IV SOLN
950.0000 [IU]/h | INTRAVENOUS | Status: DC
  Administered 2024-02-18: 950 [IU]/h via INTRAVENOUS
  Filled 2024-02-18: qty 250

## 2024-02-18 MED ORDER — ASPIRIN 81 MG PO TBEC
81.0000 mg | DELAYED_RELEASE_TABLET | Freq: Every day | ORAL | Status: DC
  Administered 2024-02-18 – 2024-02-20 (×3): 81 mg via ORAL
  Filled 2024-02-18 (×3): qty 1

## 2024-02-18 MED ORDER — NITROGLYCERIN 1 MG/10 ML FOR IR/CATH LAB
INTRA_ARTERIAL | Status: AC
  Filled 2024-02-18: qty 10

## 2024-02-18 SURGICAL SUPPLY — 24 items
BALLN ARMADA 10X40X80 (BALLOONS) ×1 IMPLANT
BALLN LUTONIX 5X220X130 (BALLOONS) ×1 IMPLANT
BALLN LUTONIX DCB 7X60X130 (BALLOONS) ×1 IMPLANT
BALLOON ARMADA 10X40X80 (BALLOONS) IMPLANT
BALLOON LUTONIX 5X220X130 (BALLOONS) IMPLANT
BALLOON LUTONIX DCB 7X60X130 (BALLOONS) IMPLANT
CANISTER PENUMBRA ENGINE (MISCELLANEOUS) IMPLANT
CATH ANGIO 5F PIGTAIL 65CM (CATHETERS) IMPLANT
CATH INDIGO 7D KIT (CATHETERS) IMPLANT
CATH VERT 5X100 (CATHETERS) IMPLANT
COVER PROBE ULTRASOUND 5X96 (MISCELLANEOUS) IMPLANT
DEVICE PRESTO INFLATION (MISCELLANEOUS) IMPLANT
DEVICE STARCLOSE SE CLOSURE (Vascular Products) IMPLANT
GLIDEWIRE ADV .035X260CM (WIRE) IMPLANT
INTRODUCER 7FR 23CM (INTRODUCER) IMPLANT
PACK ANGIOGRAPHY (CUSTOM PROCEDURE TRAY) ×1 IMPLANT
SHEATH BRITE TIP 5FRX11 (SHEATH) IMPLANT
STENT LIFESTAR 10X60 (Permanent Stent) IMPLANT
STENT LIFESTREAM 9X38X80 (Permanent Stent) IMPLANT
STENT VIABAHN 6X250X120 (Permanent Stent) IMPLANT
SYR MEDRAD MARK 7 150ML (SYRINGE) IMPLANT
TUBING CONTRAST HIGH PRESS 72 (TUBING) IMPLANT
WIRE G V18X300CM (WIRE) IMPLANT
WIRE GUIDERIGHT .035X150 (WIRE) IMPLANT

## 2024-02-18 NOTE — Consult Note (Signed)
 Hospital Consult    Reason for Consult:  Right Lower extremity ischemia Requesting Physician:  Dr Willy Eddy Md  MRN #:  [id number]  History of Present Illness: [name] with a history of PAD status post CABG for history of CAD on aspirin continues to smoke presents to the ER for evaluation of right lower extremity pain and discoloration. Also has history of rectal cancer. Feels like around 8:00 his right lower extremity became acutely numb with pain noted discoloration. Denies any other pain or discomfort at this time.    Past Medical History:  Diagnosis Date   Aortic atherosclerosis (HCC)    Arthritis    Coronary artery disease    a.) LHC --> 100% pLAD; transferred to [facility]. b.) 1v MIDCABG 09/07/2011   Diverticulosis    History of hiatal hernia    HLD (hyperlipidemia)    HTN (hypertension)    Long term current use of antithrombotics/antiplatelets    a.) on daily DAPT therapy (ASA + clopidogrel)   Non-ST elevation MI (NSTEMI) (HCC) 09/01/2011   a.) LHC --> 100% pLAD; transferred to [facility]. b.) 1v MIDCABG (LIMA-LAD) 09/07/2011 via mid-LEFT thoracotomy   Peripheral vascular disease (HCC)    S/P CABG x 1 09/07/2011   a.) 1v (LIMA-LAD) MIDCABG at [facility]    Past Surgical History:  Procedure Laterality Date   CARDIAC CATHETERIZATION  09/04/2011   CORONARY ARTERY BYPASS GRAFT N/A 09/07/2011   Procedure: CORONARY ARTERY BYPASS GRAFT (1v mini left anterior thoracotomy approach; LIMA-LAD); Location: [facility]; Surgeon: Clent Jacks, MD   ENDOVASCULAR REPAIR/STENT GRAFT N/A 04/26/2022   Procedure: ENDOVASCULAR REPAIR/STENT GRAFT;  Surgeon: Annice Needy, MD;  Location: [facility] INVASIVE CV LAB;  Service: Cardiovascular;  Laterality: N/A;   HIATAL HERNIA REPAIR     LOWER EXTREMITY ANGIOGRAPHY Right 02/20/2022   Procedure: Lower Extremity Angiography;  Surgeon: Annice Needy, MD;  Location: [facility] INVASIVE CV LAB;  Service: Cardiovascular;  Laterality: Right;    No Known  Allergies  Prior to Admission medications   Medication Sig Start Date End Date Taking? Authorizing Provider  acetaminophen (TYLENOL) 325 MG tablet Take 650 mg by mouth every 6 (six) hours as needed.    [provider]  aspirin EC 81 MG tablet Take 81 mg by mouth daily. Swallow whole.    [provider]  atorvastatin (LIPITOR) 10 MG tablet Take 1 tablet (10 mg total) by mouth daily. 02/20/22 02/20/23  Annice Needy, MD  clopidogrel (PLAVIX) 75 MG tablet Take 1 tablet (75 mg total) by mouth daily. 02/20/22   Annice Needy, MD  ibuprofen (ADVIL) 200 MG tablet Take 200 mg by mouth every 6 (six) hours as needed.    [provider]  oxyCODONE-acetaminophen (PERCOCET/ROXICET) 5-325 MG tablet Take 1-2 tablets by mouth every 4 (four) hours as needed for moderate pain. 04/27/22   Georgiana Spinner, NP    Social History   Socioeconomic History   Marital status: Married    Spouse name: George Santos   Number of children: 5   Years of education: Not on file   Highest education level: Not on file  Occupational History   Not on file  Tobacco Use   Smoking status: Every Day    Current packs/day: 0.50    Average packs/day: 0.5 packs/day for 45.0 years (22.5 ttl pk-yrs)    Types: Cigarettes   Smokeless tobacco: Never  Vaping Use   Vaping status: Never Used  Substance and Sexual Activity  Alcohol use: Not Currently   Drug use: Never   Sexual activity: Yes  Other Topics Concern   Not on file  Social History Narrative   Lives at home wife George Santos    Social Drivers of Health   Financial Resource Strain: Medium Risk (12/20/2023)   Received from [facility]   Overall Financial Resource Strain (CARDIA)    Difficulty of Paying Living Expenses: Somewhat hard  Food Insecurity: No Food Insecurity (12/20/2023)   Received from [facility]   Hunger Vital Sign    Worried About Running Out of Food in the Last Year: Never true    Ran Out of Food in the Last Year: Never true   Transportation Needs: No Transportation Needs (12/20/2023)   Received from [facility]    Lack of Transportation (Medical): No    Lack of Transportation (Non-Medical): No  Physical Activity: Not on file  Stress: Not on file  Social Connections: Not on file  Intimate Partner Violence: Not on file     No family history on file.  ROS: Otherwise negative unless mentioned in HPI  Physical Examination  Vitals:   02/18/24 1100 02/18/24 1125  BP: (!) 143/70 (!) 141/63  Pulse: 63 61  Resp: (!) 26 20  Temp:    SpO2: 100% 100%   Body mass index is 18.61 kg/m.  General:  WDWN in NAD Gait: Not observed HENT: WNL, normocephalic Pulmonary: normal non-labored breathing, without Rales, rhonchi,  wheezing Cardiac: regular, without  Murmurs, rubs or gallops; without carotid bruits Abdomen: Positive bowel sounds throughout,  soft, NT/ND, no masses Skin: without rashes Vascular Exam/Pulses: No Doppler Left Femoral. Popliteal or DP?PT pulses. Right doppler femoral pulse no popliteal, DP/PT pulses.  Extremities: with ischemic changes, without Gangrene , without cellulitis; without open wounds;  Musculoskeletal: no muscle wasting or atrophy  Neurologic: A&O X 3;  No focal weakness or paresthesias are detected; speech is fluent/normal Psychiatric:  The pt has Normal affect. Lymph:  Unremarkable  CBC    Component Value Date/Time   WBC 4.9 02/18/2024 0947   RBC 4.97 02/18/2024 0947   HGB 12.4 (L) 02/18/2024 0947   HGB 17.3 07/12/2013 1755   HCT 39.7 02/18/2024 0947   HCT 49.8 07/12/2013 1755   PLT 230 02/18/2024 0947   PLT 348 07/12/2013 1755   MCV 79.9 (L) 02/18/2024 0947   MCV 89 07/12/2013 1755   MCH 24.9 (L) 02/18/2024 0947   MCHC 31.2 02/18/2024 0947   RDW 23.9 (H) 02/18/2024 0947   RDW 13.3 07/12/2013 1755   LYMPHSABS 0.2 (L) 02/18/2024 0947   MONOABS 0.9 02/18/2024 0947   EOSABS 0.0 02/18/2024 0947   BASOSABS 0.0 02/18/2024 0947    BMET     Component Value Date/Time   NA 133 (L) 02/18/2024 0947   NA 135 (L) 07/12/2013 1755   K 3.7 02/18/2024 0947   K 4.0 07/12/2013 1755   CL 100 02/18/2024 0947   CL 105 07/12/2013 1755   CO2 19 (L) 02/18/2024 0947   CO2 20 (L) 07/12/2013 1755   GLUCOSE 179 (H) 02/18/2024 0947   GLUCOSE 80 07/12/2013 1755   BUN 23 02/18/2024 0947   BUN 13 07/12/2013 1755   CREATININE 1.06 02/18/2024 0947   CREATININE 1.12 07/12/2013 1755   CALCIUM 8.4 (L) 02/18/2024 0947   CALCIUM 8.5 07/12/2013 1755   GFRNONAA >60 02/18/2024 0947   GFRNONAA >60 07/12/2013 1755   GFRAA >60 07/12/2013 1755  COAGS: Lab Results  Component Value Date   INR 1.3 (H) 02/18/2024   INR 1.2 04/26/2022   INR 1.0 04/06/2022     Non-Invasive Vascular Imaging:   NONE ORDERED   Statin:  Yes.   Beta Blocker:  No. Aspirin:  Yes.   ACEI:  No. ARB:  No. CCB use:  No Other antiplatelets/anticoagulants:  Yes.   Plavix 75 mg  Daily   ASSESSMENT/PLAN: [name] who presents to [facility] emergency department with right lower extremity pain and ischemia.  Upon exam this morning in the emergency room the patient does not have a right femoral Doppler pulse popliteal Doppler pulse or posttibial and dorsalis pedis Doppler pulse.  Right lower extremity/foot is cold to touch and mottled.  Left lower extremity has positive Doppler femoral pulse but weak I was unable to obtain a popliteal, dorsalis pedis and posttibial Doppler pulse on the left.  I had a long discussion with the patient and his son at the bedside this morning about his condition.  Vascular surgery recommends the patient go to the vascular lab later today on 02/18/2024 for right lower extremity angiogram with possible intervention.  We discussed in detail the procedure, benefits, risk, and complications.  Both patient and son verbalized her understanding wish to proceed as soon as possible.  I answered all the questions this morning.  Patient endorses he has been  n.p.o. for the last 24 hours.  Therefore we will proceed with the procedure today.  Patient currently takes aspirin 81 mg daily only.   -I discussed the case with Dr. Festus Barren MD and he agrees with the plan.   Marcie Bal [facility] 02/18/2024 11:30 AM

## 2024-02-18 NOTE — Op Note (Signed)
 [facility]  Percutaneous Study/Intervention Procedural Note   Date of Surgery: 02/18/2024  Surgeon(s):Zakk Borgen    Assistants:none  Pre-operative Diagnosis: PAD with rest pain RLE, acute on chronic ischemia with thrombosis of previous interventions  Post-operative diagnosis:  Same  Procedure(s) Performed:             1.  Ultrasound guidance for vascular access left femoral artery             2.  Catheter placement into right popliteal from left femoral approach             3.  Aortogram and selective right lower extremity angiogram             4.  Mechanic thrombectomy using the penumbra CAT 7D device to the right common iliac artery, external iliac artery, common femoral artery, and superficial femoral artery             5.  Covered stent placement to the right common iliac artery for high-grade residual stenosis with thrombus using a 9 mm diameter by 38 mm length Lifestream stent postdilated to a 10 mm diameter  6.  Additional stent placement to the right external iliac artery with a 10 mm diameter by 6 cm length life star stent  7.  Covered stent placement to the right SFA for residual thrombus as well as stenosis in multiple locations using 6 mm diameter by 25 cm length Viabahn stent             8.  StarClose closure device left femoral artery  EBL: 150 cc  Contrast: 70 cc  Fluoro Time: 7.4 minutes  Moderate Conscious Sedation Time: approximately 62 minutes using 4 mg of Versed and 125 mcg of Fentanyl              Indications:  Patient is a 68 y.o.male with an acutely ischemic right lower extremity.  He has had previous SFA intervention as well as a previous Endologix stent graft for aortoiliac disease.  He also had no palpable pulses on the left leg, but this was not nearly as painful. The patient is brought in for angiography for further evaluation and potential treatment.  Due to the limb threatening nature of the situation, angiogram was performed for  attempted limb salvage. The patient is aware that if the procedure fails, amputation would be expected.  The patient also understands that even with successful revascularization, amputation may still be required due to the severity of the situation.  Risks and benefits are discussed and informed consent is obtained.   Procedure:  The patient was identified and appropriate procedural time out was performed.  The patient was then placed supine on the table and prepped and draped in the usual sterile fashion. Moderate conscious sedation was administered during a face to face encounter with the patient throughout the procedure with my supervision of the RN administering medicines and monitoring the patient's vital signs, pulse oximetry, telemetry and mental status throughout from the start of the procedure until the patient was taken to the recovery room. Ultrasound was used to evaluate the left common femoral artery.  It was patent .  A digital ultrasound image was acquired.  A Seldinger needle was used to access the left common femoral artery under direct ultrasound guidance and a permanent image was performed.  A 0.035 J wire was advanced without resistance and a 5Fr sheath was placed.  Pigtail catheter was placed into the aorta and an AP  aortogram was performed. This demonstrated normal renal arteries.  The aortic portion of the stent graft was patent.  The right limb of the stent graft was thrombosed and there was thrombus down into the external iliac artery and partially occlusive thrombus in the common femoral artery and superficial femoral artery.  There was also some small amount of thrombus at the aortic bifurcation.  The left limb of the bypass graft appeared patent and the left external iliac artery was small and somewhat diseased. I then crossed the aortic bifurcation and advanced to the right femoral head.  This was very easy to cross due to the thrombotic nature of the occlusion.  Selective right lower  extremity angiogram was then performed. This demonstrated the nonocclusive thrombus in the right common femoral artery and proximal SFA.  There was then a high-grade native stenosis in the mid to distal SFA of greater than 90%.  The popliteal artery normalized.  The flow distally was extremely sluggish but there appeared to be an anterior tibial artery patent distally as the only obvious runoff. It was felt that it was in the patient's best interest to proceed with intervention after these images to avoid a second procedure and a larger amount of contrast and fluoroscopy based off of the findings from the initial angiogram. The patient was systemically heparinized and a 7 French 23 cm sheath was then placed over the Terumo Advantage wire. I then used a Kumpe catheter and the advantage wire to navigate through the SFA stenosis and then parked a V18 wire distally.  We began by performing mechanical thrombectomy with the penumbra CAT 7D catheter.  Multiple passes were made throughout the right common femoral artery primarily but also throughout the right external iliac artery, common femoral artery, and proximal superficial femoral artery.  After large amounts of thrombus were removed, there uncovered a greater than 70% residual stenosis with some associated thrombus in the common iliac artery in the area of the old stent. The right external iliac artery had about a 50% stenosis.  The thrombus in the common femoral artery had cleared but there still remained nonocclusive thrombus in the proximal to mid right SFA.  There is also the high-grade stenosis in the mid to distal SFA.  I treated the right common iliac artery with a 9 mm diameter by 38 mm length Lifestream stent postdilated with a 10 mm balloon with excellent angiographic completion result.  A 10 mm diameter by 6 cm length life star stent was placed in the right external iliac artery and then postdilated with a 7 mm balloon with excellent angiographic  completion result and both iliac arteries.  There was less than 10% residual stenosis in both vessels.  To treat the SFA disease I elected to place a long covered stent to address both issues.  A 6 mm diameter by 25 cm length Viabahn stent was deployed and then postdilated with a 5 mm balloon with excellent angiographic completion result and less than 10% residual stenosis.  Distally now, the anterior tibial artery was continuous to the foot.  The posterior tibial artery appeared to be thrombosed.  The peroneal artery is very small.  I elected to terminate the procedure. The sheath was removed and StarClose closure device was deployed in the left femoral artery with excellent hemostatic result. The patient was taken to the recovery room in stable condition having tolerated the procedure well.  Findings:  Aortogram:  This demonstrated normal renal arteries.  The aortic portion of the stent graft was patent.  The right limb of the stent graft was thrombosed and there was thrombus down into the external iliac artery and partially occlusive thrombus in the common femoral artery and superficial femoral artery.  There was also some small amount of thrombus at the aortic bifurcation.  The left limb of the bypass graft appeared patent and the left external iliac artery was small and somewhat diseased.             Right Lower Extremity:  This demonstrated the nonocclusive thrombus in the right common femoral artery and proximal SFA.  There was then a high-grade native stenosis in the mid to distal SFA of greater than 90%.  The popliteal artery normalized.  The flow distally was extremely sluggish but there appeared to be an anterior tibial artery patent distally as the only obvious runoff.   Disposition: Patient was taken to the recovery room in stable condition having tolerated the procedure well.  Complications: None  Festus Barren 02/18/2024 2:35 PM   This note was created with Dragon Medical  transcription system. Any errors in dictation are purely unintentional.

## 2024-02-18 NOTE — H&P (View-Only) (Signed)
 Hospital Consult    Reason for Consult:  Right Lower extremity ischemia Requesting Physician:  Dr Willy Eddy Md  MRN #:  [id number]  History of Present Illness: [name] with a history of PAD status post CABG for history of CAD on aspirin continues to smoke presents to the ER for evaluation of right lower extremity pain and discoloration. Also has history of rectal cancer. Feels like around 8:00 his right lower extremity became acutely numb with pain noted discoloration. Denies any other pain or discomfort at this time.    Past Medical History:  Diagnosis Date   Aortic atherosclerosis (HCC)    Arthritis    Coronary artery disease    a.) LHC --> 100% pLAD; transferred to [facility]. b.) 1v MIDCABG 09/07/2011   Diverticulosis    History of hiatal hernia    HLD (hyperlipidemia)    HTN (hypertension)    Long term current use of antithrombotics/antiplatelets    a.) on daily DAPT therapy (ASA + clopidogrel)   Non-ST elevation MI (NSTEMI) (HCC) 09/01/2011   a.) LHC --> 100% pLAD; transferred to [facility]. b.) 1v MIDCABG (LIMA-LAD) 09/07/2011 via mid-LEFT thoracotomy   Peripheral vascular disease (HCC)    S/P CABG x 1 09/07/2011   a.) 1v (LIMA-LAD) MIDCABG at [facility]    Past Surgical History:  Procedure Laterality Date   CARDIAC CATHETERIZATION  09/04/2011   CORONARY ARTERY BYPASS GRAFT N/A 09/07/2011   Procedure: CORONARY ARTERY BYPASS GRAFT (1v mini left anterior thoracotomy approach; LIMA-LAD); Location: [facility]; Surgeon: Clent Jacks, MD   ENDOVASCULAR REPAIR/STENT GRAFT N/A 04/26/2022   Procedure: ENDOVASCULAR REPAIR/STENT GRAFT;  Surgeon: Annice Needy, MD;  Location: [facility] INVASIVE CV LAB;  Service: Cardiovascular;  Laterality: N/A;   HIATAL HERNIA REPAIR     LOWER EXTREMITY ANGIOGRAPHY Right 02/20/2022   Procedure: Lower Extremity Angiography;  Surgeon: Annice Needy, MD;  Location: [facility] INVASIVE CV LAB;  Service: Cardiovascular;  Laterality: Right;    No Known  Allergies  Prior to Admission medications   Medication Sig Start Date End Date Taking? Authorizing Provider  acetaminophen (TYLENOL) 325 MG tablet Take 650 mg by mouth every 6 (six) hours as needed.    [provider]  aspirin EC 81 MG tablet Take 81 mg by mouth daily. Swallow whole.    [provider]  atorvastatin (LIPITOR) 10 MG tablet Take 1 tablet (10 mg total) by mouth daily. 02/20/22 02/20/23  Annice Needy, MD  clopidogrel (PLAVIX) 75 MG tablet Take 1 tablet (75 mg total) by mouth daily. 02/20/22   Annice Needy, MD  ibuprofen (ADVIL) 200 MG tablet Take 200 mg by mouth every 6 (six) hours as needed.    [provider]  oxyCODONE-acetaminophen (PERCOCET/ROXICET) 5-325 MG tablet Take 1-2 tablets by mouth every 4 (four) hours as needed for moderate pain. 04/27/22   Georgiana Spinner, NP    Social History   Socioeconomic History   Marital status: Married    Spouse name: Virl Axe   Number of children: 5   Years of education: Not on file   Highest education level: Not on file  Occupational History   Not on file  Tobacco Use   Smoking status: Every Day    Current packs/day: 0.50    Average packs/day: 0.5 packs/day for 45.0 years (22.5 ttl pk-yrs)    Types: Cigarettes   Smokeless tobacco: Never  Vaping Use   Vaping status: Never Used  Substance and Sexual Activity  Alcohol use: Not Currently   Drug use: Never   Sexual activity: Yes  Other Topics Concern   Not on file  Social History Narrative   Lives at home wife Virl Axe    Social Drivers of Health   Financial Resource Strain: Medium Risk (12/20/2023)   Received from [facility]   Overall Financial Resource Strain (CARDIA)    Difficulty of Paying Living Expenses: Somewhat hard  Food Insecurity: No Food Insecurity (12/20/2023)   Received from [facility]   Hunger Vital Sign    Worried About Running Out of Food in the Last Year: Never true    Ran Out of Food in the Last Year: Never true   Transportation Needs: No Transportation Needs (12/20/2023)   Received from [facility]    Lack of Transportation (Medical): No    Lack of Transportation (Non-Medical): No  Physical Activity: Not on file  Stress: Not on file  Social Connections: Not on file  Intimate Partner Violence: Not on file     No family history on file.  ROS: Otherwise negative unless mentioned in HPI  Physical Examination  Vitals:   02/18/24 1100 02/18/24 1125  BP: (!) 143/70 (!) 141/63  Pulse: 63 61  Resp: (!) 26 20  Temp:    SpO2: 100% 100%   Body mass index is 18.61 kg/m.  General:  WDWN in NAD Gait: Not observed HENT: WNL, normocephalic Pulmonary: normal non-labored breathing, without Rales, rhonchi,  wheezing Cardiac: regular, without  Murmurs, rubs or gallops; without carotid bruits Abdomen: Positive bowel sounds throughout,  soft, NT/ND, no masses Skin: without rashes Vascular Exam/Pulses: No Doppler Left Femoral. Popliteal or DP?PT pulses. Right doppler femoral pulse no popliteal, DP/PT pulses.  Extremities: with ischemic changes, without Gangrene , without cellulitis; without open wounds;  Musculoskeletal: no muscle wasting or atrophy  Neurologic: A&O X 3;  No focal weakness or paresthesias are detected; speech is fluent/normal Psychiatric:  The pt has Normal affect. Lymph:  Unremarkable  CBC    Component Value Date/Time   WBC 4.9 02/18/2024 0947   RBC 4.97 02/18/2024 0947   HGB 12.4 (L) 02/18/2024 0947   HGB 17.3 07/12/2013 1755   HCT 39.7 02/18/2024 0947   HCT 49.8 07/12/2013 1755   PLT 230 02/18/2024 0947   PLT 348 07/12/2013 1755   MCV 79.9 (L) 02/18/2024 0947   MCV 89 07/12/2013 1755   MCH 24.9 (L) 02/18/2024 0947   MCHC 31.2 02/18/2024 0947   RDW 23.9 (H) 02/18/2024 0947   RDW 13.3 07/12/2013 1755   LYMPHSABS 0.2 (L) 02/18/2024 0947   MONOABS 0.9 02/18/2024 0947   EOSABS 0.0 02/18/2024 0947   BASOSABS 0.0 02/18/2024 0947    BMET     Component Value Date/Time   NA 133 (L) 02/18/2024 0947   NA 135 (L) 07/12/2013 1755   K 3.7 02/18/2024 0947   K 4.0 07/12/2013 1755   CL 100 02/18/2024 0947   CL 105 07/12/2013 1755   CO2 19 (L) 02/18/2024 0947   CO2 20 (L) 07/12/2013 1755   GLUCOSE 179 (H) 02/18/2024 0947   GLUCOSE 80 07/12/2013 1755   BUN 23 02/18/2024 0947   BUN 13 07/12/2013 1755   CREATININE 1.06 02/18/2024 0947   CREATININE 1.12 07/12/2013 1755   CALCIUM 8.4 (L) 02/18/2024 0947   CALCIUM 8.5 07/12/2013 1755   GFRNONAA >60 02/18/2024 0947   GFRNONAA >60 07/12/2013 1755   GFRAA >60 07/12/2013 1755  COAGS: Lab Results  Component Value Date   INR 1.3 (H) 02/18/2024   INR 1.2 04/26/2022   INR 1.0 04/06/2022     Non-Invasive Vascular Imaging:   NONE ORDERED   Statin:  Yes.   Beta Blocker:  No. Aspirin:  Yes.   ACEI:  No. ARB:  No. CCB use:  No Other antiplatelets/anticoagulants:  Yes.   Plavix 75 mg  Daily   ASSESSMENT/PLAN: [name] who presents to [facility] emergency department with right lower extremity pain and ischemia.  Upon exam this morning in the emergency room the patient does not have a right femoral Doppler pulse popliteal Doppler pulse or posttibial and dorsalis pedis Doppler pulse.  Right lower extremity/foot is cold to touch and mottled.  Left lower extremity has positive Doppler femoral pulse but weak I was unable to obtain a popliteal, dorsalis pedis and posttibial Doppler pulse on the left.  I had a long discussion with the patient and his son at the bedside this morning about his condition.  Vascular surgery recommends the patient go to the vascular lab later today on 02/18/2024 for right lower extremity angiogram with possible intervention.  We discussed in detail the procedure, benefits, risk, and complications.  Both patient and son verbalized her understanding wish to proceed as soon as possible.  I answered all the questions this morning.  Patient endorses he has been  n.p.o. for the last 24 hours.  Therefore we will proceed with the procedure today.  Patient currently takes aspirin 81 mg daily only.   -I discussed the case with Dr. Festus Barren MD and he agrees with the plan.   [facility] 02/18/2024 11:30 AM

## 2024-02-18 NOTE — ED Triage Notes (Signed)
 Pt to ED via [facility] from for c/o right leg pain that began at 800 this morning. Pt had stents placed one year ago. Leg is numb to palpation and no pedal pulse detected per EMS.

## 2024-02-18 NOTE — Assessment & Plan Note (Signed)
 On aspirin

## 2024-02-18 NOTE — H&P (Addendum)
 History and Physical    Patient: George Santos YQM:[id number] DOB: 08-06-1956 DOA: 02/18/2024 DOS: the patient was seen and examined on 02/18/2024 PCP: Pcp, No  Patient coming from: Home  Chief Complaint:  Chief Complaint  Patient presents with   Leg Pain   HPI: George Santos is a 68 y.o. male with medical history significant of peripheral vascular disease, tobacco abuse, stage III rectal cancer, coronary disease, hypertension, hyperlipidemia presenting with acute limb ischemia.  Patient reports right lower extremity coolness to touch over the past 2+ days.  Baseline history of peripheral vascular disease.  Noted of had a prior aortic stent placement with Dr. Wyn Quaker with vascular surgery May 2023.  Coolness to touch led to progressive weakness and inability ambulate over the past 12 hours or so.  Still smoking 1/2 to 1 pack/day.  No reported alcohol use.  Noted baseline stage III rectal cancer.  Being followed in the [facility] system.  On radiation and chemotherapy.  Minimal abdominal pain.  No focal hemiparesis or confusion. Presented to the ER afebrile, hemodynamically stable.  Satting well on room air.  White count 4.9, hemoglobin 12.4, platelets 230, creatinine 1.06, glucose 179. Review of Systems: As mentioned in the history of present illness. All other systems reviewed and are negative. Past Medical History:  Diagnosis Date   Aortic atherosclerosis (HCC)    Arthritis    Coronary artery disease    a.) LHC --> 100% pLAD; transferred to [facility]. b.) 1v MIDCABG 09/07/2011   Diverticulosis    History of hiatal hernia    HLD (hyperlipidemia)    HTN (hypertension)    Long term current use of antithrombotics/antiplatelets    a.) on daily DAPT therapy (ASA + clopidogrel)   Non-ST elevation MI (NSTEMI) (HCC) 09/01/2011   a.) LHC --> 100% pLAD; transferred to [facility]. b.) 1v MIDCABG (LIMA-LAD) 09/07/2011 via mid-LEFT thoracotomy   Peripheral vascular disease (HCC)    S/P CABG x 1 09/07/2011   a.) 1v  (LIMA-LAD) MIDCABG at [facility]   Past Surgical History:  Procedure Laterality Date   CARDIAC CATHETERIZATION  09/04/2011   CORONARY ARTERY BYPASS GRAFT N/A 09/07/2011   Procedure: CORONARY ARTERY BYPASS GRAFT (1v mini left anterior thoracotomy approach; LIMA-LAD); Location: [facility]; Surgeon: Clent Jacks, MD   ENDOVASCULAR REPAIR/STENT GRAFT N/A 04/26/2022   Procedure: ENDOVASCULAR REPAIR/STENT GRAFT;  Surgeon: Annice Needy, MD;  Location: [facility] INVASIVE CV LAB;  Service: Cardiovascular;  Laterality: N/A;   HIATAL HERNIA REPAIR     LOWER EXTREMITY ANGIOGRAPHY Right 02/20/2022   Procedure: Lower Extremity Angiography;  Surgeon: Annice Needy, MD;  Location: [facility] INVASIVE CV LAB;  Service: Cardiovascular;  Laterality: Right;   Social History:  reports that he has been smoking cigarettes. He has a 22.5 pack-year smoking history. He has never used smokeless tobacco. He reports that he does not currently use alcohol. He reports that he does not use drugs.  No Known Allergies  History reviewed. No pertinent family history.  Prior to Admission medications   Medication Sig Start Date End Date Taking? Authorizing Provider  acetaminophen (TYLENOL) 325 MG tablet Take 650 mg by mouth every 6 (six) hours as needed.    [provider]  aspirin EC 81 MG tablet Take 81 mg by mouth daily. Swallow whole.    [provider]  atorvastatin (LIPITOR) 10 MG tablet Take 1 tablet (10 mg total) by mouth daily. 02/20/22 02/20/23  Annice Needy, MD  clopidogrel (PLAVIX) 75 MG tablet Take 1 tablet (  75 mg total) by mouth daily. 02/20/22   Annice Needy, MD  ibuprofen (ADVIL) 200 MG tablet Take 200 mg by mouth every 6 (six) hours as needed.    [provider]  oxyCODONE-acetaminophen (PERCOCET/ROXICET) 5-325 MG tablet Take 1-2 tablets by mouth every 4 (four) hours as needed for moderate pain. 04/27/22   Georgiana Spinner, NP    Physical Exam: Vitals:   02/18/24 1219 02/18/24 1240 02/18/24 1308 02/18/24 1320   BP:  121/68 131/69   Pulse:  (!) 59 60   Resp:  14 15   Temp:  99.4 F (37.4 C) 98.6 F (37 C)   TempSrc: Oral Oral Oral   SpO2:  95% 96% 94%  Weight:      Height:       Physical Exam Constitutional:      Appearance: He is normal weight.  HENT:     Head: Normocephalic and atraumatic.     Nose: Nose normal.     Mouth/Throat:     Mouth: Mucous membranes are moist.  Eyes:     Pupils: Pupils are equal, round, and reactive to light.  Cardiovascular:     Rate and Rhythm: Normal rate and regular rhythm.  Pulmonary:     Effort: Pulmonary effort is normal.  Abdominal:     General: Bowel sounds are normal.  Musculoskeletal:        General: Normal range of motion.     Cervical back: Normal range of motion.     Comments: Right lower extremity cool to touch  Skin:    General: Skin is warm.  Neurological:     General: No focal deficit present.  Psychiatric:        Mood and Affect: Mood normal.     Data Reviewed:  There are no new results to review at this time.  PERIPHERAL VASCULAR CATHETERIZATION See surgical note for result.  Lab Results  Component Value Date   WBC 4.9 02/18/2024   HGB 12.4 (L) 02/18/2024   HCT 39.7 02/18/2024   MCV 79.9 (L) 02/18/2024   PLT 230 02/18/2024   Last metabolic panel Lab Results  Component Value Date   GLUCOSE 179 (H) 02/18/2024   NA 133 (L) 02/18/2024   K 3.7 02/18/2024   CL 100 02/18/2024   CO2 19 (L) 02/18/2024   BUN 23 02/18/2024   CREATININE 1.06 02/18/2024   GFRNONAA >60 02/18/2024   CALCIUM 8.4 (L) 02/18/2024   PROT 6.5 02/18/2024   ALBUMIN 3.4 (L) 02/18/2024   BILITOT 0.8 02/18/2024   ALKPHOS 41 02/18/2024   AST 18 02/18/2024   ALT 15 02/18/2024   ANIONGAP 14 02/18/2024    Assessment and Plan: * Limb ischemia Right lower extremity cool to touch x 2 to 3 days with concern for acute limb ischemia Noted baseline history of significant aortoiliac disease status post aortic stenting  Vascular surgery consulted   Heparin gtt in plan  Pending vascular intervention today  Follow up recommendations    CAD (coronary artery disease) Baseline hx/o CAD s/p CABG  No active CP  Cont home regimen  Monitor   Rectal cancer (HCC) Baseline stage 3 rectal cancer followed in the [facility] system On radiation and chemotherapy Monitor   Atherosclerosis of native artery of both lower extremities with intermittent claudication (HCC) On aspirin, Plavix and statin chronically On heparin drip acutely in the setting of limb ischemia Monitor  Essential hypertension BP stable  Titrate home regimen    Tobacco use  disorder 1/2-1 PPD smoker  Discussed cessation  Nicotine patch    Greater than 50% was spent in counseling and coordination of care with patient Total encounter time 80 minutes or more    Advance Care Planning:   Code Status: Prior   Consults: Vascular Surgery   Family Communication: Son at the bedside   Severity of Illness: The appropriate patient status for this patient is INPATIENT. Inpatient status is judged to be reasonable and necessary in order to provide the required intensity of service to ensure the patient's safety. The patient's presenting symptoms, physical exam findings, and initial radiographic and laboratory data in the context of their chronic comorbidities is felt to place them at high risk for further clinical deterioration. Furthermore, it is not anticipated that the patient will be medically stable for discharge from the hospital within 2 midnights of admission.   * I certify that at the point of admission it is my clinical judgment that the patient will require inpatient hospital care spanning beyond 2 midnights from the point of admission due to high intensity of service, high risk for further deterioration and high frequency of surveillance required.*  Author: Floydene Flock, MD 02/18/2024 1:28 PM  For on call review [email or web address].

## 2024-02-18 NOTE — ED Provider Notes (Signed)
 [facility] Provider Note    Event Date/Time   First MD Initiated Contact with Patient 02/18/24 [phone]     (approximate)   History   Leg Pain   HPI  George Santos is a 68 y.o. male with a history of PAD status post CABG for history of CAD on aspirin continues to smoke presents to the ER for evaluation of right lower extremity pain and discoloration.  Also has history of rectal cancer.  Feels like around 8:00 his right lower extremity became acutely numb with pain noted discoloration.  Denies any other pain or discomfort at this time.     Physical Exam   Triage Vital Signs: ED Triage Vitals  Encounter Vitals Group     BP 02/18/24 0941 (!) 198/67     Systolic BP Percentile --      Diastolic BP Percentile --      Pulse Rate 02/18/24 0941 61     Resp 02/18/24 0941 18     Temp 02/18/24 0941 97.7 F (36.5 C)     Temp src --      SpO2 02/18/24 0941 100 %     Weight 02/18/24 0942 126 lb (57.2 kg)     Height 02/18/24 0942 5\' 9"  (1.753 m)     Head Circumference --      Peak Flow --      Pain Score 02/18/24 0942 10     Pain Loc --      Pain Education --      Exclude from Growth Chart --     Most recent vital signs: Vitals:   02/18/24 0941  BP: (!) 198/67  Pulse: 61  Resp: 18  Temp: 97.7 F (36.5 C)  SpO2: 100%     Constitutional: Alert  Eyes: Conjunctivae are normal.  Head: Atraumatic. Nose: No congestion/rhinnorhea. Mouth/Throat: Mucous membranes are moist.   Neck: Painless ROM.  Cardiovascular:   Decreased lower extremity peripheral perfusion but the right leg does appear white with significant delayed cap refill up to the knee.  Unable to palpate DP or PT pulses.  Compartments are soft Respiratory: Normal respiratory effort.  No retractions.  Gastrointestinal: Soft and nontender.  Musculoskeletal:  no deformity     ED Results / Procedures / Treatments   Labs (all labs ordered are listed, but only abnormal results are  displayed) Labs Reviewed  CBC WITH DIFFERENTIAL/PLATELET  COMPREHENSIVE METABOLIC PANEL  PROTIME-INR  APTT     EKG  ED ECG REPORT I, Willy Eddy, the attending physician, personally viewed and interpreted this ECG.   Date: 02/18/2024  EKG Time: 10:03  Rate: 60  Rhythm: sinus  Axis: normal  Intervals: normal  ST&T Change:  no stemi    RADIOLOGY Please see ED Course for my review and interpretation.  I personally reviewed all radiographic images ordered to evaluate for the above acute complaints and reviewed radiology reports and findings.  These findings were personally discussed with the patient.  Please see medical record for radiology report.    PROCEDURES:  Critical Care performed: Yes, see critical care procedure note(s)  .Critical Care  Performed by: Willy Eddy, MD Authorized by: Willy Eddy, MD   Critical care provider statement:    Critical care time (minutes):  35   Critical care was necessary to treat or prevent imminent or life-threatening deterioration of the following conditions:  Circulatory failure   Critical care was time spent personally by me on the following activities:  Ordering and performing treatments and interventions, ordering and review of laboratory studies, ordering and review of radiographic studies, pulse oximetry, re-evaluation of patient's condition, review of old charts, obtaining history from patient or surrogate, examination of patient, evaluation of patient's response to treatment, discussions with primary provider, discussions with consultants and development of treatment plan with patient or surrogate    MEDICATIONS ORDERED IN ED: Medications  morphine (PF) 4 MG/ML injection 4 mg (4 mg Intravenous Given 02/18/24 0958)  ondansetron (ZOFRAN) injection 4 mg (4 mg Intravenous Given 02/18/24 0958)     IMPRESSION / MDM / ASSESSMENT AND PLAN / ED COURSE  I reviewed the triage vital signs and the nursing notes.                               Differential diagnosis includes, but is not limited to, PAD, claudication, acute limb ischemia, embolic phenomenon, shock  Patient presenting to the ER for evaluation of symptoms as described above.  Based on symptoms, risk factors and considered above differential, this presenting complaint could reflect a potentially life-threatening illness therefore the patient will be placed on continuous pulse oximetry and telemetry for monitoring.  Laboratory evaluation will be sent to evaluate for the above complaints.      Clinical Course as of 02/18/24 1007  Mon Feb 18, 2024  1003 Given the patient's acute physical exam findings history I did consult vascular surgery. awaiting renal function.  Will heparinize.  Will plan consult with hospitalist for admission. [PR]    Clinical Course User Index [PR] Willy Eddy, MD     FINAL CLINICAL IMPRESSION(S) / ED DIAGNOSES   Final diagnoses:  Right leg pain  Atherosclerosis of native artery of right leg with rest pain (HCC)     Rx / DC Orders   ED Discharge Orders     None        Note:  This document was prepared using Dragon voice recognition software and may include unintentional dictation errors.    Willy Eddy, MD 02/18/24 1007

## 2024-02-18 NOTE — Assessment & Plan Note (Signed)
 BP stable Titrate home regimen

## 2024-02-18 NOTE — Interval H&P Note (Signed)
 History and Physical Interval Note:  02/18/2024 1:22 PM  George Santos  has presented today for surgery, with the diagnosis of Right Lower Extremity  Ischemia.  The various methods of treatment have been discussed with the patient and family. After consideration of risks, benefits and other options for treatment, the patient has consented to  Procedure(s): Lower Extremity Angiography (Right) as a surgical intervention.  The patient's history has been reviewed, patient examined, no change in status, stable for surgery.  I have reviewed the patient's chart and labs.  Questions were answered to the patient's satisfaction.     Festus Barren

## 2024-02-18 NOTE — Assessment & Plan Note (Signed)
 Right lower extremity cool to touch x 2 to 3 days with concern for acute limb ischemia Noted baseline history of significant aortoiliac disease status post aortic stenting  Vascular surgery consulted  Heparin gtt in plan  Pending vascular intervention today  Follow up recommendations

## 2024-02-18 NOTE — Assessment & Plan Note (Addendum)
 On aspirin, Plavix and statin chronically On heparin drip acutely in the setting of limb ischemia Monitor

## 2024-02-18 NOTE — Assessment & Plan Note (Signed)
 Patient needs to stop smoking

## 2024-02-18 NOTE — Assessment & Plan Note (Signed)
 Baseline stage 3 rectal cancer followed in the [facility] system On radiation and chemotherapy Will need close follow-up since he is on Eliquis and aspirin to watch for bleeding.

## 2024-02-19 ENCOUNTER — Other Ambulatory Visit: Payer: Self-pay

## 2024-02-19 ENCOUNTER — Encounter: Payer: Self-pay | Admitting: Vascular Surgery

## 2024-02-19 DIAGNOSIS — E872 Acidosis, unspecified: Secondary | ICD-10-CM | POA: Diagnosis not present

## 2024-02-19 DIAGNOSIS — A419 Sepsis, unspecified organism: Secondary | ICD-10-CM | POA: Diagnosis not present

## 2024-02-19 DIAGNOSIS — R197 Diarrhea, unspecified: Secondary | ICD-10-CM | POA: Diagnosis not present

## 2024-02-19 DIAGNOSIS — I70221 Atherosclerosis of native arteries of extremities with rest pain, right leg: Principal | ICD-10-CM

## 2024-02-19 DIAGNOSIS — I998 Other disorder of circulatory system: Secondary | ICD-10-CM | POA: Diagnosis not present

## 2024-02-19 DIAGNOSIS — F17200 Nicotine dependence, unspecified, uncomplicated: Secondary | ICD-10-CM | POA: Diagnosis not present

## 2024-02-19 DIAGNOSIS — R239 Unspecified skin changes: Secondary | ICD-10-CM | POA: Diagnosis not present

## 2024-02-19 LAB — COMPREHENSIVE METABOLIC PANEL
ALT: 23 U/L (ref 0–44)
AST: 71 U/L — ABNORMAL HIGH (ref 15–41)
Albumin: 2.9 g/dL — ABNORMAL LOW (ref 3.5–5.0)
Alkaline Phosphatase: 31 U/L — ABNORMAL LOW (ref 38–126)
Anion gap: 8 (ref 5–15)
BUN: 17 mg/dL (ref 8–23)
CO2: 21 mmol/L — ABNORMAL LOW (ref 22–32)
Calcium: 7.9 mg/dL — ABNORMAL LOW (ref 8.9–10.3)
Chloride: 102 mmol/L (ref 98–111)
Creatinine, Ser: 0.96 mg/dL (ref 0.61–1.24)
GFR, Estimated: >60 mL/min (ref >60)
Glucose, Bld: 133 mg/dL — ABNORMAL HIGH (ref 70–99)
Potassium: 3.7 mmol/L (ref 3.5–5.1)
Sodium: 131 mmol/L — ABNORMAL LOW (ref 135–145)
Total Bilirubin: 0.6 mg/dL (ref 0.0–1.2)
Total Protein: 5.5 g/dL — ABNORMAL LOW (ref 6.5–8.1)

## 2024-02-19 LAB — CBC
HCT: 33.3 % — ABNORMAL LOW (ref 39.0–52.0)
Hemoglobin: 10.5 g/dL — ABNORMAL LOW (ref 13.0–17.0)
MCH: 24.7 pg — ABNORMAL LOW (ref 26.0–34.0)
MCHC: 31.5 g/dL (ref 30.0–36.0)
MCV: 78.4 fL — ABNORMAL LOW (ref 80.0–100.0)
Platelets: 242 10*3/uL (ref 150–400)
RBC: 4.25 MIL/uL (ref 4.22–5.81)
RDW: 23.9 % — ABNORMAL HIGH (ref 11.5–15.5)
WBC: 3.3 10*3/uL — ABNORMAL LOW (ref 4.0–10.5)
nRBC: 0 % (ref 0.0–0.2)

## 2024-02-19 LAB — CLOSTRIDIUM DIFFICILE BY PCR, REFLEXED: Toxigenic C. Difficile by PCR: NEGATIVE

## 2024-02-19 LAB — HIV ANTIBODY (ROUTINE TESTING W REFLEX): HIV Screen 4th Generation wRfx: NONREACTIVE

## 2024-02-19 LAB — C DIFFICILE QUICK SCREEN W PCR REFLEX
C Diff antigen: NEGATIVE
C Diff toxin: POSITIVE — AB

## 2024-02-19 MED ORDER — DIPHENOXYLATE-ATROPINE 2.5-0.025 MG PO TABS: 1.0000 | ORAL_TABLET | Freq: Three times a day (TID) | ORAL | 0 refills | Status: DC | PRN

## 2024-02-19 MED ORDER — APIXABAN 5 MG PO TABS
10.0000 mg | ORAL_TABLET | Freq: Two times a day (BID) | ORAL | Status: DC
  Administered 2024-02-19 – 2024-02-20 (×3): 10 mg via ORAL
  Filled 2024-02-19 (×3): qty 2

## 2024-02-19 MED ORDER — DIPHENOXYLATE-ATROPINE 2.5-0.025 MG PO TABS
1.0000 | ORAL_TABLET | Freq: Four times a day (QID) | ORAL | Status: DC | PRN
  Administered 2024-02-19: 1 via ORAL
  Filled 2024-02-19: qty 1

## 2024-02-19 MED ORDER — SODIUM CHLORIDE 0.9 % IV BOLUS
500.0000 mL | Freq: Once | INTRAVENOUS | Status: AC
  Administered 2024-02-19: 500 mL via INTRAVENOUS

## 2024-02-19 MED ORDER — LOPERAMIDE HCL 2 MG PO CAPS
4.0000 mg | ORAL_CAPSULE | Freq: Once | ORAL | Status: AC
  Administered 2024-02-19: 4 mg via ORAL
  Filled 2024-02-19: qty 2

## 2024-02-19 MED ORDER — APIXABAN (ELIQUIS) VTE STARTER PACK (10MG AND 5MG)
ORAL_TABLET | ORAL | 0 refills | Status: DC
  Filled 2024-02-19: qty 74, 30d supply, fill #0

## 2024-02-19 NOTE — Care Management Obs Status (Signed)
 MEDICARE OBSERVATION STATUS NOTIFICATION   Patient Details  Name: George Santos MRN: [id number] Date of Birth: Jun 17, 1956   Medicare Observation Status Notification Given:  Yes    Margarito Liner, LCSW 02/19/2024, 1:29 PM

## 2024-02-19 NOTE — Assessment & Plan Note (Signed)
 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  Stool for C. difficile negative.  Will send off comprehensive panel.  As needed Lomotil since Imodium does not work for him.

## 2024-02-19 NOTE — Hospital Course (Signed)
 Hospital course / significant events:   HPI: 68 y.o. male with medical history significant of peripheral vascular disease, tobacco abuse, stage III rectal cancer, coronary disease, hypertension, hyperlipidemia presenting with acute limb ischemia.  Patient reports right lower extremity coolness to touch over the past 2+ days prior to coming to ED, along w/ progressive weakness and inability ambulate over the past 12 hours or so.  Noted of had a prior aortic stent placement with Dr. Wyn Quaker with vascular surgery May 2023. Still smoking 1/2 to 1 pack/day.  No reported alcohol use.  Noted baseline stage III rectal cancer followed in the [facility] on radiation and chemotherapy.   03/17: to ED. admitted to hospitalist service. Vascular procedure had a mechanical thrombectomy to the right common iliac artery, external iliac artery and common femoral artery and superficial femoral artery, stents to the right common iliac, right external iliac and right SFA 3/18.  Patient was converted off Aggrastat drip and switched to Eliquis.  Vascular surgery cleared to go home on Eliquis and aspirin.  Patient has chronic diarrhea with his treatments for rectal cancer.  Patient has some mottling of his lower back and extremities but still has good pulses.  Discharge canceled.     Consultants:  Vascular Surgery   Procedures/Surgeries: 02/18/24: RLE angio w/ thrombectomy and stent placement      ASSESSMENT & PLAN:   Limb ischemia Right lower extremity cool to touch x 2 to 3 days with concern for acute limb ischemia Much improved with vascular procedure yesterday.  Patient on aspirin and Eliquis and Lipitor.   Skin change Mottling of his back legs and arms.  Patient still has good pulses right lower extremity.  Will watch overnight.  Obtain an echocardiogram give a fluid bolus.  Blood pressure stable.   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  Stool for C. difficile negative.  Will send off  comprehensive panel.  As needed Lomotil since Imodium does not work for him.   Tobacco use disorder Patient needs to stop smoking   CAD (coronary artery disease) On aspirin.   Rectal cancer (HCC) Baseline stage 3 rectal cancer followed in the [facility] system On radiation and chemotherapy Will need close follow-up since he is on Eliquis and aspirin to watch for bleeding.   Atherosclerosis of native artery of both lower extremities with intermittent claudication (HCC) On aspirin and Eliquis      *** based on BMI: Body mass index is 18.61 kg/m.  Underweight - under 18  overweight - 25 to 29 obese - 30 or more Class 1 obesity: BMI of 30.0 to 34 Class 2 obesity: BMI of 35.0 to 39 Class 3 obesity: BMI of 40.0 to 49 Super Morbid Obesity: BMI 50-59 Super-super Morbid Obesity: BMI 60+ Significantly low or high BMI is associated with higher medical risk.  Weight management advised as adjunct to other disease management and risk reduction treatments    DVT prophylaxis: *** IV fluids: *** continuous IV fluids  Nutrition: *** Central lines / other devices: ***  Code Status: *** ACP documentation reviewed: *** none on file in [facility]  TOC needs: *** Medical barriers to dispo: ***. Expected medical readiness for discharge ***.

## 2024-02-19 NOTE — Progress Notes (Signed)
 Progress Note   Patient: George Santos ZOX:[id number] DOB: [birth year] DOA: 02/18/2024     1 DOS: the patient was seen and examined on 02/19/2024   Brief hospital course: 68 y.o. male with medical history significant of peripheral vascular disease, tobacco abuse, stage III rectal cancer, coronary disease, hypertension, hyperlipidemia presenting with acute limb ischemia.  Patient reports right lower extremity coolness to touch over the past 2+ days.  Baseline history of peripheral vascular disease.  Noted of had a prior aortic stent placement with Dr. Wyn Quaker with vascular surgery May 2023.  Coolness to touch led to progressive weakness and inability ambulate over the past 12 hours or so.  Still smoking 1/2 to 1 pack/day.  No reported alcohol use.  Noted baseline stage III rectal cancer.  Being followed in the [facility] system.  On radiation and chemotherapy.  Minimal abdominal pain.  No focal hemiparesis or confusion. Presented to the ER afebrile, hemodynamically stable.  Satting well on room air.  White count 4.9, hemoglobin 12.4, platelets 230, creatinine 1.06, glucose 179.  Vascular procedure had a mechanical thrombectomy to the right common iliac artery, external iliac artery and common femoral artery and superficial femoral artery, stents to the right common iliac, right external iliac and right SFA  3/18.  Patient was converted off Aggrastat drip and switched to Eliquis.  Vascular surgery cleared to go home on Eliquis and aspirin.  Patient has chronic diarrhea with his treatments for rectal cancer.  Patient has some mottling of his lower back and extremities but still has good pulses.  Discharge canceled.  Assessment and Plan: * Limb ischemia Right lower extremity cool to touch x 2 to 3 days with concern for acute limb ischemia Much improved with vascular procedure yesterday.  Patient on aspirin and Eliquis and Lipitor.   Skin change Mottling of his back legs and arms.  Patient still has good pulses  right lower extremity.  Will watch overnight.  Obtain an echocardiogram give a fluid bolus.  Blood pressure stable.  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  Stool for C. difficile negative.  Will send off comprehensive panel.  As needed Lomotil since Imodium does not work for him.  Tobacco use disorder Patient needs to stop smoking  CAD (coronary artery disease) On aspirin.  Rectal cancer (HCC) Baseline stage 3 rectal cancer followed in the [facility] system On radiation and chemotherapy Will need close follow-up since he is on Eliquis and aspirin to watch for bleeding.  Atherosclerosis of native artery of both lower extremities with intermittent claudication (HCC) On aspirin and Eliquis        Subjective: Patient was wanting to go home this morning but this afternoon not feeling as well feeling fatigued and his skin is mottled we will continue to watch.  Physical Exam: Vitals:   02/18/24 2352 02/19/24 0449 02/19/24 0821 02/19/24 1500  BP: 129/68 117/63 (!) 126/53 134/81  Pulse: 62 (!) 57 (!) 59 89  Resp: 18 18    Temp: 98.1 F (36.7 C) 98.4 F (36.9 C) 98 F (36.7 C)   TempSrc:   Oral   SpO2: 96% 97% 99%   Weight:      Height:       Physical Exam HENT:     Head: Normocephalic.     Mouth/Throat:     Pharynx: No oropharyngeal exudate.  Eyes:     General: Lids are normal.     Conjunctiva/sclera: Conjunctivae normal.  Cardiovascular:  Rate and Rhythm: Normal rate and regular rhythm.     Pulses:          Dorsalis pedis pulses are 2+ on the right side.       Posterior tibial pulses are 1+ on the left side.     Heart sounds: Normal heart sounds, S1 normal and S2 normal.  Pulmonary:     Breath sounds: No decreased breath sounds, wheezing, rhonchi or rales.  Abdominal:     Palpations: Abdomen is soft.     Tenderness: There is abdominal tenderness in the suprapubic area.  Musculoskeletal:     Right lower leg: No swelling.     Left lower  leg: No swelling.  Skin:    Comments: Mottling lower back, legs and arms.  Neurological:     Mental Status: He is alert and oriented to person, place, and time.     Data Reviewed: Sodium 131, creatinine 0.96, AST 71, white blood count 3.3, hemoglobin 10.5, platelet count 242  Family Communication: Patient's wife on the phone this morning and family again at the bedside this afternoon  Disposition: Status is: Observation Discharge canceled this afternoon secondary to patient not feeling well and still having diarrhea and skin is mottling.  Planned Discharge Destination: Home    Time spent: 28 minutes  Author: Alford Highland, MD 02/19/2024 4:39 PM  For on call review [email or web address].

## 2024-02-19 NOTE — Care Management CC44 (Signed)
 Condition Code 44 Documentation Completed  Patient Details  Name: George Santos MRN: [id number] Date of Birth: 1956-07-27   Condition Code 44 given:  Yes Patient signature on Condition Code 44 notice:  Yes Documentation of 2 MD's agreement:  Yes Code 44 added to claim:  Yes    Margarito Liner, LCSW 02/19/2024, 1:29 PM

## 2024-02-19 NOTE — Evaluation (Signed)
 Occupational Therapy Evaluation Patient Details Name: George Santos MRN: [id number] DOB: 09/13/1956 Today's Date: 02/19/2024   History of Present Illness   68 y/o male presented to ED on 02/18/24 for R leg pain with numbness and no pedal pulse. S/p R LE angiography with mechanic thrombectomy and stent placement in R common iliac artery on 3/17. PMH: PAD s/p CABG, stage III rectal cancer, NSTEMI, HTN, PVD, tobacco abuse     Clinical Impressions Chart reviewed, pt greeted in bed, agreeable to OT evaluation. Pt reports he has been incontinent of BM, able to perform wash up/peri care with supervision after provided equipment. Pt reports this has been happening since cancer treatment. PTA pt reports he is generally MOD I with ADL/IADL, amb with no AD however limited community distances due to treatments at this time. Pt presents with deficits in activity tolerance, affecting safe and optimal ADL completion. Pt performed ADL/amb with supervision, OT will follow acutely to optimize ADL performance with prn use of energy conservation techniques. Pt is left in bedside chair, all needs met.      If plan is discharge home, recommend the following:   A little help with bathing/dressing/bathroom;Assistance with cooking/housework;Help with stairs or ramp for entrance     Functional Status Assessment   Patient has had a recent decline in their functional status and demonstrates the ability to make significant improvements in function in a reasonable and predictable amount of time.     Equipment Recommendations   None recommended by OT     Recommendations for Other Services         Precautions/Restrictions   Precautions Precautions: Fall Recall of Precautions/Restrictions: Intact     Mobility Bed Mobility Overal bed mobility: Independent                  Transfers Overall transfer level: Modified independent Equipment used: None                      Balance  Overall balance assessment: Needs assistance Sitting-balance support: Feet supported       Standing balance support: No upper extremity supported Standing balance-Leahy Scale: Good                             ADL either performed or assessed with clinical judgement   ADL Overall ADL's : Needs assistance/impaired Eating/Feeding: Set up   Grooming: Supervision/safety;Standing Grooming Details (indicate cue type and reason): at sink level     Lower Body Bathing: Supervison/ safety;Sit to/from stand       Lower Body Dressing: Minimal assistance;Sitting/lateral leans;Bed level Lower Body Dressing Details (indicate cue type and reason): donn socks Toilet Transfer: Supervision/safety;Contact guard assist;Ambulation   Toileting- Clothing Manipulation and Hygiene: Supervision/safety;Sit to/from stand Toileting - Clothing Manipulation Details (indicate cue type and reason): continent BM on toilet, incontinent BM in bed     Functional mobility during ADLs: Supervision/safety (approx 100' with no AD)       Vision Baseline Vision/History: 0 No visual deficits Patient Visual Report: No change from baseline       Perception         Praxis         Pertinent Vitals/Pain Pain Assessment Pain Assessment: 0-10 Pain Score: 3  Pain Location: sacrum/buttocks     Extremity/Trunk Assessment Upper Extremity Assessment Upper Extremity Assessment: Overall WFL for tasks assessed   Lower Extremity Assessment Lower Extremity Assessment: Overall WFL for  tasks assessed   Cervical / Trunk Assessment Cervical / Trunk Assessment: Normal   Communication Communication Communication: No apparent difficulties   Cognition Arousal: Alert Behavior During Therapy: WFL for tasks assessed/performed Cognition: No apparent impairments                               Following commands: Intact       Cueing  General Comments   Cueing Techniques: Verbal cues  vss  throughout   Exercises Other Exercises Other Exercises: edu re: role of OT, role of rehab, energy conservation techniques, use of DME for safe ADL completion   Shoulder Instructions      Home Living Family/patient expects to be discharged to:: Private residence Living Arrangements: Spouse/significant other Available Help at Discharge: Family;Available 24 hours/day Type of Home: Mobile home Home Access: Stairs to enter Entrance Stairs-Number of Steps: 4-5 Entrance Stairs-Rails: Can reach both Home Layout: One level     Bathroom Shower/Tub: Chief Strategy Officer: Standard     Home Equipment: Agricultural consultant (2 wheels);Cane - single point          Prior Functioning/Environment Prior Level of Function : Independent/Modified Independent             Mobility Comments: amb with no AD, limited community distances at this time due to cancer tx ADLs Comments: MOD I-I in ADL/IADL    OT Problem List: Decreased activity tolerance   OT Treatment/Interventions: Self-care/ADL training;DME and/or AE instruction;Therapeutic activities;Therapeutic exercise;Patient/family education;Energy conservation      OT Goals(Current goals can be found in the care plan section)   Acute Rehab OT Goals Patient Stated Goal: go home OT Goal Formulation: With patient Time For Goal Achievement: 03/04/24 Potential to Achieve Goals: Good ADL Goals Pt Will Perform Grooming: Independently Pt Will Perform Lower Body Dressing: with modified independence;sitting/lateral leans;sit to/from stand Pt Will Transfer to Toilet: with modified independence;ambulating Pt Will Perform Toileting - Clothing Manipulation and hygiene: with modified independence;sitting/lateral leans;sit to/from stand   OT Frequency:  Min 2X/week    Co-evaluation PT/OT/SLP Co-Evaluation/Treatment: Yes Reason for Co-Treatment: To address functional/ADL transfers;Other (comment) (unsure of tolerance)           AM-PAC OT "6 Clicks" Daily Activity     Outcome Measure Help from another person eating meals?: None Help from another person taking care of personal grooming?: None Help from another person toileting, which includes using toliet, bedpan, or urinal?: A Little Help from another person bathing (including washing, rinsing, drying)?: A Little Help from another person to put on and taking off regular upper body clothing?: None Help from another person to put on and taking off regular lower body clothing?: A Little 6 Click Score: 21   End of Session Nurse Communication: Mobility status  Activity Tolerance: Patient tolerated treatment well Patient left: in chair;with call bell/phone within reach;with chair alarm set  OT Visit Diagnosis: Other abnormalities of gait and mobility (R26.89)                Time: 0865-7846 OT Time Calculation (min): 24 min Charges:  OT General Charges $OT Visit: 1 Visit OT Evaluation $OT Eval Low Complexity: 1 Low  Oleta Mouse, OTD OTR/L  02/19/24, 1:00 PM

## 2024-02-19 NOTE — Evaluation (Signed)
 Physical Therapy Evaluation & Discharge  Patient Details Name: TAVARES LEVINSON MRN: [id number] DOB: 1956-03-07 Today's Date: 02/19/2024  History of Present Illness  68 y/o male presented to ED on 02/18/24 for R leg pain with numbness and no pedal pulse. S/p R LE angiography with mechanic thrombectomy and stent placement in R common iliac artery on 3/17. PMH: PAD s/p CABG, stage III rectal cancer, NSTEMI, HTN, PVD, tobacco abuse  Clinical Impression  Patient admitted with the above. PTA, patient lives with wife and was independent with no AD. Patient continues to function at modI level with no AD. Supervision for ambulation due to IV pole management. Patient complaining of rectal pain but no pain in BLE. No further skilled PT needs identified acutely. No PT follow up recommended at this time.         If plan is discharge home, recommend the following:     Can travel by private vehicle        Equipment Recommendations None recommended by PT  Recommendations for Other Services       Functional Status Assessment Patient has had a recent decline in their functional status and demonstrates the ability to make significant improvements in function in a reasonable and predictable amount of time.     Precautions / Restrictions Precautions Precautions: None Restrictions Weight Bearing Restrictions Per Provider Order: No      Mobility  Bed Mobility Overal bed mobility: Independent                  Transfers Overall transfer level: Modified independent Equipment used: None                    Ambulation/Gait Ambulation/Gait assistance: Supervision Gait Distance (Feet): 150 Feet Assistive device: None Gait Pattern/deviations: Step-through pattern, Decreased stride length Gait velocity: decreased     General Gait Details: supervision for safety and IV pole management  Stairs            Wheelchair Mobility     Tilt Bed    Modified Rankin (Stroke Patients  Only)       Balance Overall balance assessment: No apparent balance deficits (not formally assessed)                                           Pertinent Vitals/Pain Pain Assessment Pain Assessment: 0-10 Pain Score: 3  Pain Location: sacrum/buttocks Pain Descriptors / Indicators: Discomfort, Grimacing Pain Intervention(s): Monitored during session    Home Living Family/patient expects to be discharged to:: Private residence Living Arrangements: Spouse/significant other Available Help at Discharge: Family;Available 24 hours/day Type of Home: Mobile home Home Access: Stairs to enter Entrance Stairs-Rails: Can reach both Entrance Stairs-Number of Steps: 4-5   Home Layout: One level Home Equipment: Agricultural consultant (2 wheels);Cane - single point      Prior Function Prior Level of Function : Independent/Modified Independent             Mobility Comments: amb with no AD, limited community distances at this time due to cancer tx ADLs Comments: MOD I-I in ADL/IADL     Extremity/Trunk Assessment   Upper Extremity Assessment Upper Extremity Assessment: Defer to OT evaluation    Lower Extremity Assessment Lower Extremity Assessment: Overall WFL for tasks assessed    Cervical / Trunk Assessment Cervical / Trunk Assessment: Normal  Communication   Communication Communication: No  apparent difficulties    Cognition Arousal: Alert Behavior During Therapy: WFL for tasks assessed/performed   PT - Cognitive impairments: No apparent impairments                         Following commands: Intact       Cueing       General Comments      Exercises     Assessment/Plan    PT Assessment Patient does not need any further PT services  PT Problem List         PT Treatment Interventions      PT Goals (Current goals can be found in the Care Plan section)  Acute Rehab PT Goals Patient Stated Goal: to go home PT Goal Formulation: All  assessment and education complete, DC therapy    Frequency       Co-evaluation               AM-PAC PT "6 Clicks" Mobility  Outcome Measure Help needed turning from your back to your side while in a flat bed without using bedrails?: None Help needed moving from lying on your back to sitting on the side of a flat bed without using bedrails?: None Help needed moving to and from a bed to a chair (including a wheelchair)?: None Help needed standing up from a chair using your arms (e.g., wheelchair or bedside chair)?: None Help needed to walk in hospital room?: A Little Help needed climbing 3-5 steps with a railing? : A Little 6 Click Score: 22    End of Session   Activity Tolerance: Patient tolerated treatment well Patient left: in chair;with call bell/phone within reach;with chair alarm set Nurse Communication: Mobility status PT Visit Diagnosis: Muscle weakness (generalized) (M62.81)    Time: 1610-9604 PT Time Calculation (min) (ACUTE ONLY): 26 min   Charges:   PT Evaluation $PT Eval Moderate Complexity: 1 Mod   PT General Charges $$ ACUTE PT VISIT: 1 Visit         Maylon Peppers, PT, DPT Physical Therapist - [facility]  [facility]   Jabar Krysiak A Lenzi Marmo 02/19/2024, 10:56 AM

## 2024-02-19 NOTE — Discharge Instructions (Addendum)
 Watch for bleeding Need to follow up hemoglobin closely Stop smoking     Some PCP options in [place] area- not a comprehensive list  [facility]- [phone] [facility]- [phone] [facility]- [phone] [facility]- [phone] [facility]- [phone] [facility]- [phone]  or [facility] Physician Referral Line [phone]

## 2024-02-19 NOTE — Assessment & Plan Note (Addendum)
 Mottling of his back legs and arms.  Patient still has good pulses right lower extremity.  Will watch overnight.  Obtain an echocardiogram give a fluid bolus.  Blood pressure stable.

## 2024-02-19 NOTE — Progress Notes (Signed)
 Generalized mottling of skin noted. V/s stable, right radial pulse +2, left radial pulse +2, right dorsalis pedis pulse +2, left pedal pulses absent since admission, MD aware. Notified Dr Renae Gloss of generalized mottling of skin.

## 2024-02-19 NOTE — TOC CM/SW Note (Addendum)
 Transition of Care [facility]) - Inpatient Brief Assessment   Patient Details  Name: George Santos MRN: [id number] Date of Birth: 03-12-1956  Transition of Care [facility]) CM/SW Contact:    Margarito Liner, LCSW Phone Number: 02/19/2024, 1:31 PM   Clinical Narrative: Patient has orders to discharge home today. Chart reviewed. Patient does not have a PCP. CSW added list to AVS. PT and OT evaluated and determined no needs. Patient confirmed he should have a ride. No other TOC needs identified. CSW signing off.  Transition of Care Asessment: Insurance and Status: Insurance coverage has been reviewed Patient has primary care physician: No Home environment has been reviewed: Mobile home Prior level of function:: Independent-Modified independent Prior/Current Home Services: No current home services Social Drivers of Health Review: SDOH reviewed no interventions necessary Readmission risk has been reviewed: Yes Transition of care needs: no transition of care needs at this time

## 2024-02-19 NOTE — Progress Notes (Signed)
 Progress Note    02/19/2024 9:31 AM 1 Day Post-Op  Subjective:  George Santos is a 68 yo male who is now postop day 1 from an aortogram with selective right lower extremity angiogram of his common iliac artery, external iliac artery, common femoral artery, and superficial femoral artery.  He also had mechanical thrombectomy with stent placements to both the right common iliac artery and the right external iliac artery as well as a covered stent placed to the right SFA for residual thrombus.  On exam this morning patient was walking the halls with physical therapy.  He denies any resting pain as well as pain upon ambulation.  He endorses some left groin soreness but other than that said he feels great and wishes to go home.   Vitals:   02/19/24 0449 02/19/24 0821  BP: 117/63 (!) 126/53  Pulse: (!) 57 (!) 59  Resp: 18   Temp: 98.4 F (36.9 C) 98 F (36.7 C)  SpO2: 97% 99%   Physical Exam: Cardiac:  RRR, normal S1 and S2.  No murmurs appreciated Lungs: Lungs clear on auscultation throughout, no rales rhonchi or wheezing. Incisions: Groin puncture with dressing clean dry and intact.  No hematoma seroma to note. Extremities: Lateral lower extremities warm to touch.  Palpable bilateral DP and PT pulses. Abdomen: Positive bowel sounds throughout, soft, nontender and nondistended. Neurologic: And oriented x 4, answers all questions and follows commands appropriately.  CBC    Component Value Date/Time   WBC 3.3 (L) 02/19/2024 0417   RBC 4.25 02/19/2024 0417   HGB 10.5 (L) 02/19/2024 0417   HGB 17.3 07/12/2013 1755   HCT 33.3 (L) 02/19/2024 0417   HCT 49.8 07/12/2013 1755   PLT 242 02/19/2024 0417   PLT 348 07/12/2013 1755   MCV 78.4 (L) 02/19/2024 0417   MCV 89 07/12/2013 1755   MCH 24.7 (L) 02/19/2024 0417   MCHC 31.5 02/19/2024 0417   RDW 23.9 (H) 02/19/2024 0417   RDW 13.3 07/12/2013 1755   LYMPHSABS 0.2 (L) 02/18/2024 0947   MONOABS 0.9 02/18/2024 0947   EOSABS 0.0  02/18/2024 0947   BASOSABS 0.0 02/18/2024 0947    BMET    Component Value Date/Time   NA 131 (L) 02/19/2024 0417   NA 135 (L) 07/12/2013 1755   K 3.7 02/19/2024 0417   K 4.0 07/12/2013 1755   CL 102 02/19/2024 0417   CL 105 07/12/2013 1755   CO2 21 (L) 02/19/2024 0417   CO2 20 (L) 07/12/2013 1755   GLUCOSE 133 (H) 02/19/2024 0417   GLUCOSE 80 07/12/2013 1755   BUN 17 02/19/2024 0417   BUN 13 07/12/2013 1755   CREATININE 0.96 02/19/2024 0417   CREATININE 1.12 07/12/2013 1755   CALCIUM 7.9 (L) 02/19/2024 0417   CALCIUM 8.5 07/12/2013 1755   GFRNONAA >60 02/19/2024 0417   GFRNONAA >60 07/12/2013 1755   GFRAA >60 07/12/2013 1755    INR    Component Value Date/Time   INR 1.3 (H) 02/18/2024 0947     Intake/Output Summary (Last 24 hours) at 02/19/2024 0931 Last data filed at 02/19/2024 0410 Gross per 24 hour  Intake 1104.83 ml  Output 300 ml  Net 804.83 ml     Assessment/Plan:  68 y.o. male is s/p aortogram with selective right lower extremity angiogram with mechanical thrombectomy and stent placements to the right common iliac artery and right external iliac artery and a covered stent to the right SFA.  1 Day Post-Op  PLAN Discontinue Aggrastat infusion. Start Eliquis 10 mg twice daily for 7 days then convert to 5 mg twice daily indefinitely. Start aspirin 81 mg daily. Okay per vascular surgery for patient to discharge home once converted from Aggrastat infusion to oral anticoagulation medications. Patient to follow-up in clinic in 1 month with ultrasounds of her right lower extremity.  DVT prophylaxis: Aggrastat infusion   Marcie Bal [facility] 02/19/2024 9:31 AM

## 2024-02-20 ENCOUNTER — Observation Stay: Admit: 2024-02-20

## 2024-02-20 DIAGNOSIS — I998 Other disorder of circulatory system: Secondary | ICD-10-CM | POA: Diagnosis not present

## 2024-02-20 LAB — CBC
HCT: 32.7 % — ABNORMAL LOW (ref 39.0–52.0)
Hemoglobin: 10.6 g/dL — ABNORMAL LOW (ref 13.0–17.0)
MCH: 24.8 pg — ABNORMAL LOW (ref 26.0–34.0)
MCHC: 32.4 g/dL (ref 30.0–36.0)
MCV: 76.6 fL — ABNORMAL LOW (ref 80.0–100.0)
Platelets: 259 10*3/uL (ref 150–400)
RBC: 4.27 MIL/uL (ref 4.22–5.81)
RDW: 24.1 % — ABNORMAL HIGH (ref 11.5–15.5)
WBC: 3.5 10*3/uL — ABNORMAL LOW (ref 4.0–10.5)
nRBC: 0 % (ref 0.0–0.2)

## 2024-02-20 LAB — BASIC METABOLIC PANEL
Anion gap: 10 (ref 5–15)
BUN: 19 mg/dL (ref 8–23)
CO2: 19 mmol/L — ABNORMAL LOW (ref 22–32)
Calcium: 8.1 mg/dL — ABNORMAL LOW (ref 8.9–10.3)
Chloride: 101 mmol/L (ref 98–111)
Creatinine, Ser: 0.87 mg/dL (ref 0.61–1.24)
GFR, Estimated: >60 mL/min (ref >60)
Glucose, Bld: 150 mg/dL — ABNORMAL HIGH (ref 70–99)
Potassium: 3.4 mmol/L — ABNORMAL LOW (ref 3.5–5.1)
Sodium: 130 mmol/L — ABNORMAL LOW (ref 135–145)

## 2024-02-20 MED ORDER — CALCIUM CARBONATE ANTACID 500 MG PO CHEW: 1.0000 | CHEWABLE_TABLET | Freq: Three times a day (TID) | ORAL | Status: DC | PRN

## 2024-02-20 NOTE — Discharge Summary (Signed)
 Physician Discharge Summary   Patient: George Santos MRN: [id number]  DOB: 03-02-1956   Admit:     Date of Admission: 02/18/2024 Admitted from: home   Discharge: Date of discharge: 02/20/24 Disposition: Home Condition at discharge: good  CODE STATUS: FULL CODE     Discharge Physician: Sunnie Nielsen, DO [facility]     PCP: Pcp, No  Recommendations for Outpatient Follow-up:  Follow up with PCP 1-2 weeks Follow up with vascular surgery in 1 week     Discharge Instructions     Diet - low sodium heart healthy   Complete by: As directed    Increase activity slowly   Complete by: As directed          Discharge Diagnoses: Principal Problem:   Limb ischemia Active Problems:   Skin change   Diarrhea   Tobacco use disorder   Atherosclerosis of native artery of both lower extremities with intermittent claudication (HCC)   Rectal cancer (HCC)   CAD (coronary artery disease)   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 male with medical history significant of peripheral vascular disease, tobacco abuse, stage III rectal cancer, coronary disease, hypertension, hyperlipidemia presenting with acute limb ischemia.  Patient reports right lower extremity coolness to touch over the past 2+ days prior to coming to ED, along w/ progressive weakness and inability ambulate over the past 12 hours or so.  Noted of had a prior aortic stent placement with Dr. Wyn Quaker with vascular surgery May 2023. Still smoking 1/2 to 1 pack/day.  No reported alcohol use.  Noted baseline stage III rectal cancer followed in the [facility] on radiation and chemotherapy.   03/17: to ED. admitted to hospitalist service. Vascular procedure had a mechanical thrombectomy to the right common iliac artery, external iliac artery and common femoral artery and superficial femoral artery, stents to the right common iliac,  right external iliac and right SFA 3/18.  Patient was converted off Aggrastat drip and switched to Eliquis.  Vascular surgery cleared to go home on Eliquis and aspirin.  Patient has chronic diarrhea with his treatments for rectal cancer.  Patient has some mottling of his lower back and extremities but still has good pulses.  Discharge canceled. 3/19: doing well this morning, discharged     Consultants:  Vascular Surgery   Procedures/Surgeries: 02/18/24: RLE angio w/ thrombectomy and stent placement      ASSESSMENT & PLAN:   Limb ischemia Much improved with vascular procedure .  Patient on aspirin and Eliquis and Lipitor.   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  Stool for C. difficile negative.  Will send off comprehensive panel.  As needed Lomotil since Imodium does not work for him.   Tobacco use disorder Patient needs to stop smoking   CAD (coronary artery disease) On aspirin.   Rectal cancer (HCC) Baseline stage 3 rectal cancer followed in the [facility] system On radiation and chemotherapy Will need close follow-up since he is on Eliquis and aspirin to watch for bleeding.   Atherosclerosis of native artery of both lower extremities with intermittent claudication (HCC) On aspirin and Eliquis             Discharge Instructions  Allergies as of 02/20/2024   No Known Allergies      Medication List     STOP taking these medications  clopidogrel 75 MG tablet Commonly known as: Plavix   ibuprofen 200 MG tablet Commonly known as: ADVIL   mometasone 0.1 % cream Commonly known as: ELOCON       TAKE these medications    acetaminophen 325 MG tablet Commonly known as: TYLENOL Take 650 mg by mouth every 6 (six) hours as needed.   aspirin EC 81 MG tablet Take 81 mg by mouth daily. Swallow whole.   atorvastatin 10 MG tablet Commonly known as: Lipitor Take 1 tablet (10 mg total) by mouth daily.   capecitabine 500 MG  tablet Commonly known as: XELODA Take by mouth.  Take 3 tablets (1,500 mg total) by mouth every twelve (12) hours . Only take on radiation treatment days (Monday - Friday). Take morning dose before each radiation treatment. Take with full glass of water after a meal, within 30 minutes   diphenoxylate-atropine 2.5-0.025 MG tablet Commonly known as: Lomotil Take 1 tablet by mouth 3 (three) times daily as needed for diarrhea or loose stools.   Eliquis DVT/PE Starter Pack Generic drug: Apixaban Starter Pack (10mg  and 5mg ) Take as directed on package: start with two-5mg  tablets twice daily for 7 days. On day 8, switch to one-5mg  tablet twice daily.   oxyCODONE 5 MG immediate release tablet Commonly known as: Oxy IR/ROXICODONE Take 10 mg by mouth every 6 (six) hours as needed.  Take 2 tablets (10 mg total) by mouth every six (6) hours as needed for pain for up to 28 days.   oxyCODONE-acetaminophen 5-325 MG tablet Commonly known as: PERCOCET/ROXICET Take 1-2 tablets by mouth every 4 (four) hours as needed for moderate pain.         Follow-up Information     Georgiana Spinner, NP Follow up in 1 month(s).   Specialty: Vascular Surgery Why: Duplex Arterial U/S Bilateral lower extremities with ABI"s  Office didn't answer the call hav the patient book this appointment Contact information: [address] [place] [address] [phone]         Stanford Breed, MD. Schedule an appointment as soon as possible for a visit in 1 week(s).   Specialty: Radiation Oncology Why: called waiting on a call back. Contact information: [id number] Radiation Oncology [place] [address] [phone]                 No Known Allergies   Subjective: pt reports feeling better this morning, ambulating okay with some assistance, reports no concerns and ok to skip echocardiogram today would like to discharge (echo ordered as precaution yesterday given  skin mottling and feeling dizzy, these have resovled0    Discharge Exam: BP 124/72 (BP Location: Right Arm)   Pulse 65   Temp 98.5 F (36.9 C)   Resp 16   Ht 5\' 9"  (1.753 m)   Wt 57.2 kg   SpO2 96%   BMI 18.61 kg/m  General: Pt is alert, awake, not in acute distress Cardiovascular: RRR, S1/S2 +, no rubs, no gallops Respiratory: CTA bilaterally, no wheezing, no rhonchi Abdominal: Soft, NT, ND, bowel sounds + Extremities: no edema, no cyanosis     The results of significant diagnostics from this hospitalization (including imaging, microbiology, ancillary and laboratory) are listed below for reference.     Microbiology: Recent Results (from the past 240 hours)  C Difficile Quick Screen w PCR reflex     Status: Abnormal   Collection Time: 02/19/24 12:56 PM   Specimen: STOOL  Result  Value Ref Range Status   C Diff antigen NEGATIVE NEGATIVE Final   C Diff toxin POSITIVE (A) NEGATIVE Final   C Diff interpretation Results are indeterminate. See PCR results.  Final    Comment: Performed at [facility], [address] [place], [place] [address]  C. Diff by PCR, Reflexed     Status: None   Collection Time: 02/19/24 12:56 PM  Result Value Ref Range Status   Toxigenic C. Difficile by PCR NEGATIVE NEGATIVE Final    Comment: Patient is colonized with non toxigenic C. difficile. May not need treatment unless significant symptoms are present. Performed at [facility], [address] [place], [place] [address]      Labs: BNP (last 3 results) No results for input(s): "BNP" in the last 8760 hours. Basic Metabolic Panel: Recent Labs  Lab 02/18/24 0947 02/19/24 0417 02/20/24 0416  NA 133* 131* 130*  K 3.7 3.7 3.4*  CL 100 102 101  CO2 19* 21* 19*  GLUCOSE 179* 133* 150*  BUN 23 17 19   CREATININE 1.06 0.96 0.87  CALCIUM 8.4* 7.9* 8.1*   Liver Function Tests: Recent Labs  Lab 02/18/24 0947 02/19/24 0417  AST 18 71*  ALT 15 23  ALKPHOS 41 31*   BILITOT 0.8 0.6  PROT 6.5 5.5*  ALBUMIN 3.4* 2.9*   No results for input(s): "LIPASE", "AMYLASE" in the last 168 hours. No results for input(s): "AMMONIA" in the last 168 hours. CBC: Recent Labs  Lab 02/18/24 0947 02/19/24 0417 02/20/24 0416  WBC 4.9 3.3* 3.5*  NEUTROABS 3.8  --   --   HGB 12.4* 10.5* 10.6*  HCT 39.7 33.3* 32.7*  MCV 79.9* 78.4* 76.6*  PLT 230 242 259   Cardiac Enzymes: No results for input(s): "CKTOTAL", "CKMB", "CKMBINDEX", "TROPONINI" in the last 168 hours. BNP: Invalid input(s): "POCBNP" CBG: No results for input(s): "GLUCAP" in the last 168 hours. D-Dimer No results for input(s): "DDIMER" in the last 72 hours. Hgb A1c No results for input(s): "HGBA1C" in the last 72 hours. Lipid Profile No results for input(s): "CHOL", "HDL", "LDLCALC", "TRIG", "CHOLHDL", "LDLDIRECT" in the last 72 hours. Thyroid function studies No results for input(s): "TSH", "T4TOTAL", "T3FREE", "THYROIDAB" in the last 72 hours.  Invalid input(s): "FREET3" Anemia work up No results for input(s): "VITAMINB12", "FOLATE", "FERRITIN", "TIBC", "IRON", "RETICCTPCT" in the last 72 hours. Urinalysis    Component Value Date/Time   COLORURINE Straw 07/12/2013 1755   APPEARANCEUR Clear 07/12/2013 1755   LABSPEC 1.005 07/12/2013 1755   PHURINE 5.0 07/12/2013 1755   GLUCOSEU Negative 07/12/2013 1755   HGBUR 1+ 07/12/2013 1755   BILIRUBINUR Negative 07/12/2013 1755   KETONESUR Negative 07/12/2013 1755   PROTEINUR Negative 07/12/2013 1755   NITRITE Negative 07/12/2013 1755   LEUKOCYTESUR Negative 07/12/2013 1755   Sepsis Labs Recent Labs  Lab 02/18/24 0947 02/19/24 0417 02/20/24 0416  WBC 4.9 3.3* 3.5*   Microbiology Recent Results (from the past 240 hours)  C Difficile Quick Screen w PCR reflex     Status: Abnormal   Collection Time: 02/19/24 12:56 PM   Specimen: STOOL  Result Value Ref Range Status   C Diff antigen NEGATIVE NEGATIVE Final   C Diff toxin POSITIVE (A)  NEGATIVE Final   C Diff interpretation Results are indeterminate. See PCR results.  Final    Comment: Performed at [facility], [address] [place], [place] [address]  C. Diff by PCR, Reflexed     Status: None  Collection Time: 02/19/24 12:56 PM  Result Value Ref Range Status   Toxigenic C. Difficile by PCR NEGATIVE NEGATIVE Final    Comment: Patient is colonized with non toxigenic C. difficile. May not need treatment unless significant symptoms are present. Performed at [facility], [address] [place], [place] [address]    Imaging PERIPHERAL VASCULAR CATHETERIZATION Result Date: 02/18/2024 See surgical note for result.     Time coordinating discharge: over 30 minutes  SIGNED:  Sunnie Nielsen DO [facility]

## 2024-02-20 NOTE — Progress Notes (Signed)
 Progress Note    02/20/2024 1:00 PM 2 Days Post-Op  Subjective:   George Santos is a 68 yo male who is now postop day 1 from an aortogram with selective right lower extremity angiogram of his common iliac artery, external iliac artery, common femoral artery, and superficial femoral artery.  He also had mechanical thrombectomy with stent placements to both the right common iliac artery and the right external iliac artery as well as a covered stent placed to the right SFA for residual thrombus.   On exam this morning patient was walking the halls with physical therapy.  He denies any resting pain as well as pain upon ambulation.  He endorses some left groin soreness but other than that said he feels great and wishes to go home.   Vitals:   02/20/24 0356 02/20/24 0906  BP: (!) 117/56 (!) 142/72  Pulse: 66 66  Resp: 18 16  Temp: 98.6 F (37 C) 97.6 F (36.4 C)  SpO2: 96% 97%   Physical Exam: Cardiac:  RRR, normal S1 and S2.  No murmurs appreciated Lungs: Lungs clear on auscultation throughout, no rales rhonchi or wheezing. Incisions: Groin puncture with dressing clean dry and intact.  No hematoma seroma to note. Extremities: Lateral lower extremities warm to touch.  Palpable bilateral DP and PT pulses. Abdomen: Positive bowel sounds throughout, soft, nontender and nondistended. Neurologic: And oriented x 4, answers all questions and follows commands appropriately.  CBC    Component Value Date/Time   WBC 3.5 (L) 02/20/2024 0416   RBC 4.27 02/20/2024 0416   HGB 10.6 (L) 02/20/2024 0416   HGB 17.3 07/12/2013 1755   HCT 32.7 (L) 02/20/2024 0416   HCT 49.8 07/12/2013 1755   PLT 259 02/20/2024 0416   PLT 348 07/12/2013 1755   MCV 76.6 (L) 02/20/2024 0416   MCV 89 07/12/2013 1755   MCH 24.8 (L) 02/20/2024 0416   MCHC 32.4 02/20/2024 0416   RDW 24.1 (H) 02/20/2024 0416   RDW 13.3 07/12/2013 1755   LYMPHSABS 0.2 (L) 02/18/2024 0947   MONOABS 0.9 02/18/2024 0947   EOSABS 0.0  02/18/2024 0947   BASOSABS 0.0 02/18/2024 0947    BMET    Component Value Date/Time   NA 130 (L) 02/20/2024 0416   NA 135 (L) 07/12/2013 1755   K 3.4 (L) 02/20/2024 0416   K 4.0 07/12/2013 1755   CL 101 02/20/2024 0416   CL 105 07/12/2013 1755   CO2 19 (L) 02/20/2024 0416   CO2 20 (L) 07/12/2013 1755   GLUCOSE 150 (H) 02/20/2024 0416   GLUCOSE 80 07/12/2013 1755   BUN 19 02/20/2024 0416   BUN 13 07/12/2013 1755   CREATININE 0.87 02/20/2024 0416   CREATININE 1.12 07/12/2013 1755   CALCIUM 8.1 (L) 02/20/2024 0416   CALCIUM 8.5 07/12/2013 1755   GFRNONAA >60 02/20/2024 0416   GFRNONAA >60 07/12/2013 1755   GFRAA >60 07/12/2013 1755    INR    Component Value Date/Time   INR 1.3 (H) 02/18/2024 0947     Intake/Output Summary (Last 24 hours) at 02/20/2024 1300 Last data filed at 02/20/2024 0900 Gross per 24 hour  Intake 210.83 ml  Output 362 ml  Net -151.17 ml     Assessment/Plan:  68 y.o. male is s/p aortogram with selective right lower extremity angiogram with mechanical thrombectomy and stent placements to the right common iliac artery and right external iliac artery and a covered stent to the right SFA.  2 Days Post-Op  PLAN Continue Eliquis 10 mg twice daily for 7 days then convert to 5 mg twice daily indefinitely. Continue aspirin 81 mg daily. Okay per vascular surgery for patient to discharge home now on oral anticoagulation. Patient to follow-up in clinic in 1 month with ultrasounds of his right lower extremity.   DVT prophylaxis: Eliquis 10 mg BID and ASA 81 mg Daily   Amit Meloy R Aundreya [facility] 02/20/2024 1:00 PM

## 2024-02-21 ENCOUNTER — Other Ambulatory Visit (INDEPENDENT_AMBULATORY_CARE_PROVIDER_SITE_OTHER): Payer: Self-pay | Admitting: Vascular Surgery

## 2024-02-21 ENCOUNTER — Other Ambulatory Visit: Payer: Self-pay

## 2024-02-21 LAB — GASTROINTESTINAL PANEL BY PCR, STOOL (REPLACES STOOL CULTURE)

## 2024-02-21 MED ORDER — ATORVASTATIN CALCIUM 10 MG PO TABS
10.0000 mg | ORAL_TABLET | Freq: Every day | ORAL | 6 refills | Status: DC
Start: 2024-02-21 — End: 2024-02-23
  Filled 2024-02-21: qty 30, 30d supply, fill #0

## 2024-02-22 ENCOUNTER — Emergency Department

## 2024-02-22 ENCOUNTER — Inpatient Hospital Stay
Admission: EM | Admit: 2024-02-22 | Discharge: 2024-03-04 | DRG: 871 | Disposition: E | Attending: Pulmonary Disease | Admitting: Pulmonary Disease

## 2024-02-22 ENCOUNTER — Other Ambulatory Visit: Payer: Self-pay

## 2024-02-22 ENCOUNTER — Encounter: Payer: Self-pay | Admitting: Emergency Medicine

## 2024-02-22 ENCOUNTER — Inpatient Hospital Stay

## 2024-02-22 DIAGNOSIS — R4182 Altered mental status, unspecified: Principal | ICD-10-CM

## 2024-02-22 DIAGNOSIS — K56609 Unspecified intestinal obstruction, unspecified as to partial versus complete obstruction: Secondary | ICD-10-CM | POA: Diagnosis present

## 2024-02-22 DIAGNOSIS — Z515 Encounter for palliative care: Secondary | ICD-10-CM | POA: Diagnosis not present

## 2024-02-22 DIAGNOSIS — Z66 Do not resuscitate: Secondary | ICD-10-CM | POA: Diagnosis present

## 2024-02-22 DIAGNOSIS — E871 Hypo-osmolality and hyponatremia: Secondary | ICD-10-CM | POA: Diagnosis present

## 2024-02-22 DIAGNOSIS — I252 Old myocardial infarction: Secondary | ICD-10-CM

## 2024-02-22 DIAGNOSIS — R6521 Severe sepsis with septic shock: Secondary | ICD-10-CM | POA: Diagnosis present

## 2024-02-22 DIAGNOSIS — Z7982 Long term (current) use of aspirin: Secondary | ICD-10-CM

## 2024-02-22 DIAGNOSIS — Z7901 Long term (current) use of anticoagulants: Secondary | ICD-10-CM

## 2024-02-22 DIAGNOSIS — A0472 Enterocolitis due to Clostridium difficile, not specified as recurrent: Secondary | ICD-10-CM | POA: Diagnosis present

## 2024-02-22 DIAGNOSIS — Z9221 Personal history of antineoplastic chemotherapy: Secondary | ICD-10-CM

## 2024-02-22 DIAGNOSIS — R339 Retention of urine, unspecified: Secondary | ICD-10-CM | POA: Diagnosis present

## 2024-02-22 DIAGNOSIS — C20 Malignant neoplasm of rectum: Secondary | ICD-10-CM | POA: Diagnosis present

## 2024-02-22 DIAGNOSIS — I2489 Other forms of acute ischemic heart disease: Secondary | ICD-10-CM | POA: Diagnosis present

## 2024-02-22 DIAGNOSIS — F17210 Nicotine dependence, cigarettes, uncomplicated: Secondary | ICD-10-CM | POA: Diagnosis present

## 2024-02-22 DIAGNOSIS — L89156 Pressure-induced deep tissue damage of sacral region: Secondary | ICD-10-CM | POA: Diagnosis present

## 2024-02-22 DIAGNOSIS — I10 Essential (primary) hypertension: Secondary | ICD-10-CM | POA: Diagnosis present

## 2024-02-22 DIAGNOSIS — A419 Sepsis, unspecified organism: Principal | ICD-10-CM | POA: Diagnosis present

## 2024-02-22 DIAGNOSIS — I739 Peripheral vascular disease, unspecified: Secondary | ICD-10-CM | POA: Diagnosis present

## 2024-02-22 DIAGNOSIS — J9601 Acute respiratory failure with hypoxia: Secondary | ICD-10-CM | POA: Diagnosis present

## 2024-02-22 DIAGNOSIS — E872 Acidosis, unspecified: Secondary | ICD-10-CM | POA: Diagnosis present

## 2024-02-22 DIAGNOSIS — J9602 Acute respiratory failure with hypercapnia: Secondary | ICD-10-CM | POA: Diagnosis present

## 2024-02-22 DIAGNOSIS — E785 Hyperlipidemia, unspecified: Secondary | ICD-10-CM | POA: Diagnosis present

## 2024-02-22 DIAGNOSIS — Z923 Personal history of irradiation: Secondary | ICD-10-CM

## 2024-02-22 DIAGNOSIS — T68XXXA Hypothermia, initial encounter: Secondary | ICD-10-CM | POA: Diagnosis not present

## 2024-02-22 DIAGNOSIS — N179 Acute kidney failure, unspecified: Secondary | ICD-10-CM | POA: Diagnosis present

## 2024-02-22 DIAGNOSIS — E8729 Other acidosis: Secondary | ICD-10-CM | POA: Diagnosis not present

## 2024-02-22 DIAGNOSIS — E8809 Other disorders of plasma-protein metabolism, not elsewhere classified: Secondary | ICD-10-CM | POA: Diagnosis present

## 2024-02-22 DIAGNOSIS — Z79899 Other long term (current) drug therapy: Secondary | ICD-10-CM

## 2024-02-22 DIAGNOSIS — D689 Coagulation defect, unspecified: Secondary | ICD-10-CM | POA: Diagnosis present

## 2024-02-22 DIAGNOSIS — I2510 Atherosclerotic heart disease of native coronary artery without angina pectoris: Secondary | ICD-10-CM | POA: Diagnosis present

## 2024-02-22 DIAGNOSIS — J438 Other emphysema: Secondary | ICD-10-CM | POA: Diagnosis present

## 2024-02-22 DIAGNOSIS — G9341 Metabolic encephalopathy: Secondary | ICD-10-CM | POA: Diagnosis present

## 2024-02-22 DIAGNOSIS — Z1152 Encounter for screening for COVID-19: Secondary | ICD-10-CM | POA: Diagnosis not present

## 2024-02-22 DIAGNOSIS — J690 Pneumonitis due to inhalation of food and vomit: Secondary | ICD-10-CM | POA: Diagnosis present

## 2024-02-22 DIAGNOSIS — Z7902 Long term (current) use of antithrombotics/antiplatelets: Secondary | ICD-10-CM

## 2024-02-22 DIAGNOSIS — Z5986 Financial insecurity: Secondary | ICD-10-CM

## 2024-02-22 DIAGNOSIS — Z951 Presence of aortocoronary bypass graft: Secondary | ICD-10-CM

## 2024-02-22 LAB — TROPONIN I (HIGH SENSITIVITY)
Troponin I (High Sensitivity): 144 ng/L (ref <18)
Troponin I (High Sensitivity): 234 ng/L (ref <18)
Troponin I (High Sensitivity): 290 ng/L (ref <18)
Troponin I (High Sensitivity): 691 ng/L (ref <18)
Troponin I (High Sensitivity): 1083 ng/L (ref <18)

## 2024-02-22 LAB — URINALYSIS, W/ REFLEX TO CULTURE (INFECTION SUSPECTED)
Bilirubin Urine: NEGATIVE
Glucose, UA: 50 mg/dL — AB
Ketones, ur: NEGATIVE mg/dL
Leukocytes,Ua: NEGATIVE
Nitrite: NEGATIVE
Protein, ur: 100 mg/dL — AB
Specific Gravity, Urine: 1.026 (ref 1.005–1.030)
pH: 5 (ref 5.0–8.0)

## 2024-02-22 LAB — CBC WITH DIFFERENTIAL/PLATELET
Abs Immature Granulocytes: 0.9 10*3/uL — ABNORMAL HIGH (ref 0.00–0.07)
Basophils Absolute: 0.1 10*3/uL (ref 0.0–0.1)
Basophils Relative: 1 %
Eosinophils Absolute: 0.3 10*3/uL (ref 0.0–0.5)
Eosinophils Relative: 3 %
HCT: 32.5 % — ABNORMAL LOW (ref 39.0–52.0)
Hemoglobin: 10.1 g/dL — ABNORMAL LOW (ref 13.0–17.0)
Immature Granulocytes: 7 %
Lymphocytes Relative: 3 %
Lymphs Abs: 0.4 10*3/uL — ABNORMAL LOW (ref 0.7–4.0)
MCH: 25.4 pg — ABNORMAL LOW (ref 26.0–34.0)
MCHC: 31.1 g/dL (ref 30.0–36.0)
MCV: 81.9 fL (ref 80.0–100.0)
Monocytes Absolute: 1.1 10*3/uL — ABNORMAL HIGH (ref 0.1–1.0)
Monocytes Relative: 9 %
Neutro Abs: 9.6 10*3/uL — ABNORMAL HIGH (ref 1.7–7.7)
Neutrophils Relative %: 77 %
Platelets: 299 10*3/uL (ref 150–400)
RBC: 3.97 MIL/uL — ABNORMAL LOW (ref 4.22–5.81)
RDW: 24.3 % — ABNORMAL HIGH (ref 11.5–15.5)
Smear Review: NORMAL
WBC: 12.4 10*3/uL — ABNORMAL HIGH (ref 4.0–10.5)
nRBC: 1.9 % — ABNORMAL HIGH (ref 0.0–0.2)

## 2024-02-22 LAB — BLOOD GAS, ARTERIAL
Acid-base deficit: 26.3 mmol/L — ABNORMAL HIGH (ref 0.0–2.0)
Acid-base deficit: 9.6 mmol/L — ABNORMAL HIGH (ref 0.0–2.0)
Bicarbonate: 7.5 mmol/L — ABNORMAL LOW (ref 20.0–28.0)
Bicarbonate: 18.7 mmol/L — ABNORMAL LOW (ref 20.0–28.0)
FIO2: 100 %
FIO2: 60 %
MECHVT: 450 mL
MECHVT: 450 mL
O2 Saturation: 94.9 %
O2 Saturation: 100 %
PEEP: 5 cmH2O
PEEP: 5 cmH2O
Patient temperature: 37
Patient temperature: 35.6
RATE: 18 {breaths}/min
RATE: 18 {breaths}/min
pCO2 arterial: 44 mmHg (ref 32–48)
pCO2 arterial: 46 mmHg (ref 32–48)
pH, Arterial: <6.95 — CL (ref 7.35–7.45)
pH, Arterial: 7.21 — ABNORMAL LOW (ref 7.35–7.45)
pO2, Arterial: 100 mmHg (ref 83–108)
pO2, Arterial: 185 mmHg — ABNORMAL HIGH (ref 83–108)

## 2024-02-22 LAB — LACTIC ACID, PLASMA
Lactic Acid, Venous: >9 mmol/L (ref 0.5–1.9)
Lactic Acid, Venous: >9 mmol/L (ref 0.5–1.9)
Lactic Acid, Venous: 4.4 mmol/L (ref 0.5–1.9)
Lactic Acid, Venous: 3.2 mmol/L (ref 0.5–1.9)

## 2024-02-22 LAB — URINE DRUG SCREEN, QUALITATIVE (ARMC ONLY)
Amphetamines, Ur Screen: NOT DETECTED
Barbiturates, Ur Screen: NOT DETECTED
Benzodiazepine, Ur Scrn: NOT DETECTED
Cannabinoid 50 Ng, Ur ~~LOC~~: NOT DETECTED
Cocaine Metabolite,Ur ~~LOC~~: NOT DETECTED
MDMA (Ecstasy)Ur Screen: NOT DETECTED
Methadone Scn, Ur: NOT DETECTED
Opiate, Ur Screen: NOT DETECTED
Phencyclidine (PCP) Ur S: NOT DETECTED
Tricyclic, Ur Screen: NOT DETECTED

## 2024-02-22 LAB — FIBRINOGEN
Fibrinogen: >800 mg/dL — ABNORMAL HIGH (ref 210–475)
Fibrinogen: 794 mg/dL — ABNORMAL HIGH (ref 210–475)

## 2024-02-22 LAB — COMPREHENSIVE METABOLIC PANEL
ALT: 29 U/L (ref 0–44)
AST: 48 U/L — ABNORMAL HIGH (ref 15–41)
Albumin: 2.6 g/dL — ABNORMAL LOW (ref 3.5–5.0)
Alkaline Phosphatase: 52 U/L (ref 38–126)
Anion gap: 34 — ABNORMAL HIGH (ref 5–15)
BUN: 51 mg/dL — ABNORMAL HIGH (ref 8–23)
CO2: 11 mmol/L — ABNORMAL LOW (ref 22–32)
Calcium: 9.2 mg/dL (ref 8.9–10.3)
Chloride: 88 mmol/L — ABNORMAL LOW (ref 98–111)
Creatinine, Ser: 4.28 mg/dL — ABNORMAL HIGH (ref 0.61–1.24)
GFR, Estimated: 14 mL/min — ABNORMAL LOW (ref >60)
Glucose, Bld: 158 mg/dL — ABNORMAL HIGH (ref 70–99)
Potassium: 3.8 mmol/L (ref 3.5–5.1)
Sodium: 133 mmol/L — ABNORMAL LOW (ref 135–145)
Total Bilirubin: 0.7 mg/dL (ref 0.0–1.2)
Total Protein: 6.6 g/dL (ref 6.5–8.1)

## 2024-02-22 LAB — GLUCOSE, CAPILLARY
Glucose-Capillary: 287 mg/dL — ABNORMAL HIGH (ref 70–99)
Glucose-Capillary: 224 mg/dL — ABNORMAL HIGH (ref 70–99)
Glucose-Capillary: 130 mg/dL — ABNORMAL HIGH (ref 70–99)

## 2024-02-22 LAB — PROTIME-INR
INR: >10 (ref 0.8–1.2)
INR: 4.5 (ref 0.8–1.2)
Prothrombin Time: 84.5 s — ABNORMAL HIGH (ref 11.4–15.2)
Prothrombin Time: 42.7 s — ABNORMAL HIGH (ref 11.4–15.2)

## 2024-02-22 LAB — BASIC METABOLIC PANEL
Anion gap: 19 — ABNORMAL HIGH (ref 5–15)
BUN: 59 mg/dL — ABNORMAL HIGH (ref 8–23)
CO2: 30 mmol/L (ref 22–32)
Calcium: 7.2 mg/dL — ABNORMAL LOW (ref 8.9–10.3)
Chloride: 83 mmol/L — ABNORMAL LOW (ref 98–111)
Creatinine, Ser: 3.38 mg/dL — ABNORMAL HIGH (ref 0.61–1.24)
GFR, Estimated: 19 mL/min — ABNORMAL LOW (ref >60)
Glucose, Bld: 152 mg/dL — ABNORMAL HIGH (ref 70–99)
Potassium: 2.9 mmol/L — ABNORMAL LOW (ref 3.5–5.1)
Sodium: 132 mmol/L — ABNORMAL LOW (ref 135–145)

## 2024-02-22 LAB — RESP PANEL BY RT-PCR (RSV, FLU A&B, COVID)  RVPGX2
Influenza A by PCR: NEGATIVE
Influenza B by PCR: NEGATIVE
Resp Syncytial Virus by PCR: NEGATIVE
SARS Coronavirus 2 by RT PCR: NEGATIVE

## 2024-02-22 LAB — MAGNESIUM: Magnesium: 3.2 mg/dL — ABNORMAL HIGH (ref 1.7–2.4)

## 2024-02-22 LAB — PHOSPHORUS: Phosphorus: 11.8 mg/dL — ABNORMAL HIGH (ref 2.5–4.6)

## 2024-02-22 LAB — MRSA NEXT GEN BY PCR, NASAL: MRSA by PCR Next Gen: NOT DETECTED

## 2024-02-22 LAB — AMMONIA: Ammonia: 18 umol/L (ref 9–35)

## 2024-02-22 LAB — PROCALCITONIN: Procalcitonin: 38.32 ng/mL

## 2024-02-22 LAB — CK: Total CK: 221 U/L (ref 49–397)

## 2024-02-22 LAB — CBG MONITORING, ED: Glucose-Capillary: 75 mg/dL (ref 70–99)

## 2024-02-22 MED ORDER — FENTANYL CITRATE PF 50 MCG/ML IJ SOSY
25.0000 ug | PREFILLED_SYRINGE | INTRAMUSCULAR | Status: DC | PRN
  Administered 2024-02-22: 25 ug via INTRAVENOUS
  Filled 2024-02-22: qty 1

## 2024-02-22 MED ORDER — ROCURONIUM BROMIDE 10 MG/ML (PF) SYRINGE
PREFILLED_SYRINGE | INTRAVENOUS | Status: AC
  Administered 2024-02-22: 57 mg via INTRAVENOUS
  Filled 2024-02-22: qty 10

## 2024-02-22 MED ORDER — LACTATED RINGERS IV BOLUS (SEPSIS)
1000.0000 mL | Freq: Once | INTRAVENOUS | Status: AC
  Administered 2024-02-22: 1000 mL via INTRAVENOUS

## 2024-02-22 MED ORDER — METRONIDAZOLE 500 MG/100ML IV SOLN
500.0000 mg | Freq: Once | INTRAVENOUS | Status: AC
  Administered 2024-02-22: 500 mg via INTRAVENOUS
  Filled 2024-02-22: qty 100

## 2024-02-22 MED ORDER — VANCOMYCIN HCL IN DEXTROSE 1-5 GM/200ML-% IV SOLN
1000.0000 mg | Freq: Once | INTRAVENOUS | Status: AC
  Administered 2024-02-22: 1000 mg via INTRAVENOUS
  Filled 2024-02-22: qty 200

## 2024-02-22 MED ORDER — SODIUM CHLORIDE 0.9 % IV SOLN: INTRAVENOUS | Status: DC

## 2024-02-22 MED ORDER — POTASSIUM CHLORIDE 20 MEQ PO PACK
40.0000 meq | PACK | Freq: Once | ORAL | Status: AC
  Administered 2024-02-22: 40 meq
  Filled 2024-02-22: qty 2

## 2024-02-22 MED ORDER — DIPHENHYDRAMINE HCL 50 MG/ML IJ SOLN: 25.0000 mg | INTRAMUSCULAR | Status: DC | PRN

## 2024-02-22 MED ORDER — PROTHROMBIN COMPLEX CONC HUMAN 500 UNITS IV KIT
3000.0000 [IU] | PACK | Status: AC
  Administered 2024-02-22: 3000 [IU] via INTRAVENOUS
  Filled 2024-02-22: qty 3000

## 2024-02-22 MED ORDER — ORAL CARE MOUTH RINSE
15.0000 mL | OROMUCOSAL | Status: DC
  Administered 2024-02-22 (×6): 15 mL via OROMUCOSAL
  Filled 2024-02-22 (×5): qty 15

## 2024-02-22 MED ORDER — ORAL CARE MOUTH RINSE: 15.0000 mL | OROMUCOSAL | Status: DC | PRN

## 2024-02-22 MED ORDER — LORAZEPAM 2 MG/ML IJ SOLN
2.0000 mg | INTRAMUSCULAR | Status: DC | PRN
  Administered 2024-02-22: 2 mg via INTRAVENOUS
  Filled 2024-02-22: qty 1

## 2024-02-22 MED ORDER — HALOPERIDOL LACTATE 5 MG/ML IJ SOLN: 2.5000 mg | INTRAMUSCULAR | Status: DC | PRN

## 2024-02-22 MED ORDER — POLYETHYLENE GLYCOL 3350 17 G PO PACK
17.0000 g | PACK | Freq: Every day | ORAL | Status: DC
  Administered 2024-02-22: 17 g
  Filled 2024-02-22: qty 1

## 2024-02-22 MED ORDER — VANCOMYCIN VARIABLE DOSE PER UNSTABLE RENAL FUNCTION (PHARMACIST DOSING): Status: DC

## 2024-02-22 MED ORDER — NOREPINEPHRINE 4 MG/250ML-% IV SOLN
INTRAVENOUS | Status: AC
  Administered 2024-02-22: 10 ug/min
  Filled 2024-02-22: qty 250

## 2024-02-22 MED ORDER — POLYETHYLENE GLYCOL 3350 17 G PO PACK: 17.0000 g | PACK | Freq: Every day | ORAL | Status: DC | PRN

## 2024-02-22 MED ORDER — DOCUSATE SODIUM 50 MG/5ML PO LIQD: 100.0000 mg | Freq: Two times a day (BID) | ORAL | Status: DC | PRN

## 2024-02-22 MED ORDER — FENTANYL CITRATE PF 50 MCG/ML IJ SOSY: 25.0000 ug | PREFILLED_SYRINGE | INTRAMUSCULAR | Status: DC | PRN

## 2024-02-22 MED ORDER — VANCOMYCIN HCL 500 MG/100ML IV SOLN
500.0000 mg | Freq: Once | INTRAVENOUS | Status: AC
  Administered 2024-02-22: 500 mg via INTRAVENOUS
  Filled 2024-02-22: qty 100

## 2024-02-22 MED ORDER — ROCURONIUM BROMIDE 10 MG/ML (PF) SYRINGE: 1.0000 mg/kg | PREFILLED_SYRINGE | Freq: Once | INTRAVENOUS | Status: AC

## 2024-02-22 MED ORDER — ACETAMINOPHEN 325 MG PO TABS: 650.0000 mg | ORAL_TABLET | Freq: Four times a day (QID) | ORAL | Status: DC | PRN

## 2024-02-22 MED ORDER — ALBUMIN HUMAN 25 % IV SOLN
25.0000 g | Freq: Once | INTRAVENOUS | Status: AC
  Administered 2024-02-22: 25 g via INTRAVENOUS
  Filled 2024-02-22: qty 100

## 2024-02-22 MED ORDER — SODIUM CHLORIDE 0.9 % IV BOLUS (SEPSIS)
1000.0000 mL | Freq: Once | INTRAVENOUS | Status: AC
Start: 2024-02-22 — End: 2024-02-22
  Administered 2024-02-22: 1000 mL via INTRAVENOUS

## 2024-02-22 MED ORDER — LEVETIRACETAM IN NACL 1000 MG/100ML IV SOLN
1000.0000 mg | Freq: Once | INTRAVENOUS | Status: AC
  Administered 2024-02-22: 1000 mg via INTRAVENOUS
  Filled 2024-02-22: qty 100

## 2024-02-22 MED ORDER — KETAMINE HCL 50 MG/5ML IJ SOSY: 1.0000 mg/kg | PREFILLED_SYRINGE | Freq: Once | INTRAMUSCULAR | Status: DC

## 2024-02-22 MED ORDER — LACTATED RINGERS IV BOLUS (SEPSIS): 250.0000 mL | Freq: Once | INTRAVENOUS | Status: DC

## 2024-02-22 MED ORDER — LACTATED RINGERS IV SOLN: INTRAVENOUS | Status: DC

## 2024-02-22 MED ORDER — IOHEXOL 350 MG/ML SOLN
100.0000 mL | Freq: Once | INTRAVENOUS | Status: AC | PRN
  Administered 2024-02-22: 100 mL via INTRAVENOUS

## 2024-02-22 MED ORDER — CHLORHEXIDINE GLUCONATE CLOTH 2 % EX PADS
6.0000 | MEDICATED_PAD | Freq: Every day | CUTANEOUS | Status: DC
Start: 2024-02-22 — End: 2024-02-22
  Administered 2024-02-22: 6 via TOPICAL
  Filled 2024-02-22: qty 6

## 2024-02-22 MED ORDER — SODIUM CHLORIDE 0.9 % IV SOLN
2.0000 g | Freq: Once | INTRAVENOUS | Status: AC
  Administered 2024-02-22: 2 g via INTRAVENOUS
  Filled 2024-02-22: qty 12.5

## 2024-02-22 MED ORDER — LORAZEPAM 2 MG/ML IJ SOLN: 2.0000 mg | Freq: Once | INTRAMUSCULAR | Status: AC

## 2024-02-22 MED ORDER — PHENYLEPHRINE 80 MCG/ML (10ML) SYRINGE FOR IV PUSH (FOR BLOOD PRESSURE SUPPORT)
80.0000 ug | PREFILLED_SYRINGE | Freq: Once | INTRAVENOUS | Status: AC
  Administered 2024-02-22: 160 ug via INTRAVENOUS

## 2024-02-22 MED ORDER — GLYCOPYRROLATE 0.2 MG/ML IJ SOLN: 0.2000 mg | INTRAMUSCULAR | Status: DC | PRN

## 2024-02-22 MED ORDER — ACETAMINOPHEN 650 MG RE SUPP: 650.0000 mg | Freq: Four times a day (QID) | RECTAL | Status: DC | PRN

## 2024-02-22 MED ORDER — MANNITOL 25 % IV SOLN
1.0000 g/kg | Freq: Once | INTRAVENOUS | Status: AC
  Administered 2024-02-22: 50 g via INTRAVENOUS
  Filled 2024-02-22: qty 200

## 2024-02-22 MED ORDER — HYDROMORPHONE HCL-NACL 50-0.9 MG/50ML-% IV SOLN
0.0000 mg/h | INTRAVENOUS | Status: DC
  Administered 2024-02-22: 1 mg/h via INTRAVENOUS
  Filled 2024-02-22: qty 50

## 2024-02-22 MED ORDER — VASOPRESSIN 20 UNITS/100 ML INFUSION FOR SHOCK
0.0000 [IU]/min | INTRAVENOUS | Status: DC
  Filled 2024-02-22: qty 100

## 2024-02-22 MED ORDER — LACTATED RINGERS IV BOLUS
1000.0000 mL | Freq: Once | INTRAVENOUS | Status: AC
  Administered 2024-02-22: 1000 mL via INTRAVENOUS

## 2024-02-22 MED ORDER — MIDAZOLAM HCL 2 MG/2ML IJ SOLN: 1.0000 mg | INTRAMUSCULAR | Status: DC | PRN

## 2024-02-22 MED ORDER — PIPERACILLIN-TAZOBACTAM IN DEX 2-0.25 GM/50ML IV SOLN
2.2500 g | Freq: Four times a day (QID) | INTRAVENOUS | Status: DC
  Administered 2024-02-22 (×2): 2.25 g via INTRAVENOUS
  Filled 2024-02-22 (×3): qty 50

## 2024-02-22 MED ORDER — KETAMINE HCL 10 MG/ML IJ SOLN
INTRAMUSCULAR | Status: AC
  Administered 2024-02-22: 57 mg via INTRAVENOUS
  Filled 2024-02-22: qty 1

## 2024-02-22 MED ORDER — SODIUM BICARBONATE 8.4 % IV SOLN
150.0000 meq | Freq: Once | INTRAVENOUS | Status: AC
  Administered 2024-02-22: 150 meq via INTRAVENOUS
  Filled 2024-02-22: qty 50

## 2024-02-22 MED ORDER — NOREPINEPHRINE 4 MG/250ML-% IV SOLN
INTRAVENOUS | Status: AC
  Filled 2024-02-22: qty 250

## 2024-02-22 MED ORDER — NOREPINEPHRINE 16 MG/250ML-% IV SOLN
0.0000 ug/min | INTRAVENOUS | Status: DC
  Administered 2024-02-22: 12 ug/min via INTRAVENOUS
  Filled 2024-02-22: qty 250

## 2024-02-22 MED ORDER — POTASSIUM CHLORIDE 10 MEQ/100ML IV SOLN
10.0000 meq | INTRAVENOUS | Status: DC
  Administered 2024-02-22: 10 meq via INTRAVENOUS
  Filled 2024-02-22 (×4): qty 100

## 2024-02-22 MED ORDER — GLYCOPYRROLATE 1 MG PO TABS: 1.0000 mg | ORAL_TABLET | ORAL | Status: DC | PRN

## 2024-02-22 MED ORDER — PHENYLEPHRINE HCL-NACL 20-0.9 MG/250ML-% IV SOLN
0.0000 ug/min | INTRAVENOUS | Status: DC
  Administered 2024-02-22: 20 ug/min via INTRAVENOUS
  Filled 2024-02-22: qty 250

## 2024-02-22 MED ORDER — PHENYLEPHRINE 80 MCG/ML (10ML) SYRINGE FOR IV PUSH (FOR BLOOD PRESSURE SUPPORT): 80.0000 ug | PREFILLED_SYRINGE | Freq: Once | INTRAVENOUS | Status: DC | PRN

## 2024-02-22 MED ORDER — LORAZEPAM 2 MG/ML IJ SOLN
INTRAMUSCULAR | Status: AC
  Administered 2024-02-22: 2 mg via INTRAVENOUS
  Filled 2024-02-22: qty 1

## 2024-02-22 MED ORDER — HYDROMORPHONE BOLUS VIA INFUSION
1.0000 mg | INTRAVENOUS | Status: DC | PRN
  Administered 2024-02-22 (×2): 1 mg via INTRAVENOUS

## 2024-02-22 MED ORDER — LACTATED RINGERS IV BOLUS (SEPSIS): 500.0000 mL | Freq: Once | INTRAVENOUS | Status: DC

## 2024-02-22 MED ORDER — POLYVINYL ALCOHOL 1.4 % OP SOLN: 1.0000 [drp] | Freq: Four times a day (QID) | OPHTHALMIC | Status: DC | PRN

## 2024-02-22 MED ORDER — DOCUSATE SODIUM 50 MG/5ML PO LIQD
100.0000 mg | Freq: Two times a day (BID) | ORAL | Status: DC
  Administered 2024-02-22: 100 mg
  Filled 2024-02-22: qty 10

## 2024-02-22 MED ORDER — STERILE WATER FOR INJECTION IV SOLN
INTRAVENOUS | Status: DC
  Filled 2024-02-22 (×2): qty 1000

## 2024-02-22 NOTE — ED Notes (Signed)
 This RN to bedside to complete orders, pt appears mottled. Asked pt if this is his normal color, he shakes his head yes but doesn't talk. Moved pt's blankets to access L arm for IV access and abdomen appears very purple, mottled. Unable to obtain SpO2 measurement, pt on 4L [place]. BP 73/54.  After IV placement and blood draw completed, this RN to get MD to come into room and assess pt

## 2024-02-22 NOTE — Progress Notes (Signed)
 Initial Nutrition Assessment  DOCUMENTATION CODES:   Underweight, Severe malnutrition in context of chronic illness  INTERVENTION:   -When able to feed, consider:  Initiate Vital 1.5 @ 25 ml/hr and increase by 10 ml every 8 hours to goal rate of 55 ml/hr.   60 ml Prosource TF 20 daily  30 ml free water flush every 4 hours to maintain tube patency  Tube feeding regimen provides 2060 kcal (100% of needs), 109 grams of protein, and 1008 ml of H2O. Total free water: 1188 ml daily  -If feeds are started, pt is at high refeeding risk, recommend:   -100 mg thiamine daily x 7 days -MVI with minerals daily -Monitor Mg, K, and Phos and replete as needed; pharmacy currently managing electrolytes   NUTRITION DIAGNOSIS:   Severe Malnutrition related to chronic illness (stage III rectal cancer) as evidenced by severe fat depletion, severe muscle depletion.  GOAL:   Patient will meet greater than or equal to 90% of their needs  MONITOR:   Vent status  REASON FOR ASSESSMENT:   Ventilator    ASSESSMENT:   Pt admitted for evaluation of lower abdominal pain/bladder pain and weakness. PMH includes PVD, stahe III rectal cancer (currently receiving chemotherapy), HTN, HLD,  CAD, and Tobacco Abuse.  Pt admitted with suspected sepsis related to possible aspiration pneumonia vs intra-abdominal source.   Patient is currently intubated on ventilator support. OGT currently connected to low, intermittent suction. Per KUB on 02/22/24, tip of tube and side port in the stomach.  MV: 9 L/min Temp (24hrs), Avg:99.1 F (37.3 C), Min:91.8 F (33.2 C), Max:105.8 F (41 C)  MAP: 91  Reviewed I/O's: +2.9 L x 24 hours  UOP: 300 ml x 24 hours  OGT output: 1.5 L x 24 hours  Pt with chronic diarrhea since initiation chemotherapy and radiation for stage III rectal cancer. He missed his latest session secondary to hospitalization (3/17/-02/20/24) for acute limb ischemia s/p thrombectomy & stent  placement. He has been experiencing weakness, poor po intake, weakness, and slight numbness on rt side of face, and abdominal pain since discharge.   Per general surgery notes, no plan for surgical intervention nor would pt be a candidate at this time. Suspect small bowel and stomach dilations related to multisystem organ failure. Per RN, pt with large brown colored output from OGT.   Case discussed during ICU rounds. Pt with severe acidosis. CT of head and abdomen revealed no bleed. MD suspicious of aspiration pneumonia. Pt with severe AKI; plan to obtain US of kidney to rule out pyelonephritis.   Pt with poor prognosis, plan for palliative care consult for goals of care discussions.   Medications reviewed and include miralax, levophed, and sodium bicarbonate.   Labs reviewed: Na: 132, K: 2.9, Phos: 11.8, Mg: 3.2, CBGS: 130-287 (inpatient orders for glycemic control are none).    NUTRITION - FOCUSED PHYSICAL EXAM:  Flowsheet Row Most Recent Value  Orbital Region Severe depletion  Upper Arm Region Severe depletion  Thoracic and Lumbar Region Severe depletion  Buccal Region Severe depletion  Temple Region Severe depletion  Clavicle Bone Region Severe depletion  Clavicle and Acromion Bone Region Severe depletion  Scapular Bone Region Severe depletion  Dorsal Hand Severe depletion  Patellar Region Severe depletion  Anterior Thigh Region Severe depletion  Posterior Calf Region Severe depletion  Edema (RD Assessment) None  Hair Reviewed  Eyes Reviewed  Mouth Reviewed  Skin Reviewed       Diet Order:   Diet  Order             Diet NPO time specified  Diet effective now                   EDUCATION NEEDS:   Not appropriate for education at this time  Skin:  Skin Assessment: Skin Integrity Issues: Skin Integrity Issues:: Stage II Stage II: sacrum  Last BM:  Unknown  Height:   Ht Readings from Last 1 Encounters:  02/22/24 5\' 9"  (1.753 m)    Weight:   Wt  Readings from Last 1 Encounters:  02/22/24 55.7 kg    Ideal Body Weight:  72.7 kg  BMI:  Body mass index is 18.13 kg/m.  Estimated Nutritional Needs:   Kcal:  2188  Protein:  100-115 grams  Fluid:  2-2.2 L    Levada Schilling, RD, LDN, CDCES Registered Dietitian III Certified Diabetes Care and Education Specialist If unable to reach this RD, please use "RD Inpatient" group chat on secure chat between hours of 8am-4 pm daily

## 2024-02-22 NOTE — Progress Notes (Signed)
 Nutrition Brief Note  Chart reviewed. Pt now transitioning to comfort care.  No further nutrition interventions planned at this time.  Please re-consult as needed.   Levada Schilling, RD, LDN, CDCES Registered Dietitian III Certified Diabetes Care and Education Specialist If unable to reach this RD, please use "RD Inpatient" group chat on secure chat between hours of 8am-4 pm daily

## 2024-02-22 NOTE — ED Notes (Addendum)
 MD plan to intubate once pt BP improved

## 2024-02-22 NOTE — Progress Notes (Signed)
 Pharmacy Antibiotic Note  George Santos is a 68 y.o. male admitted on 02/22/2024 with sepsis.  Pharmacy has been consulted for Zosyn & Vancomycin dosing.  Plan: Zosyn 2.25 gm q6hr per indication & renal fxn.   SCr 4.28.  Dosing interval > 24 hrs. Pt given Vancomycin total of 1500 mg once (1000 mg followed by 500 mg 8 hrs apart.) Variable Vancomycin dosing by pharmacy.  Pharmacy will continue to follow and will adjust abx dosing whenever warranted.  Temp (24hrs), Avg:95.5 F (35.3 C), Min:91.8 F (33.2 C), Max:97 F (36.1 C)   Recent Labs  Lab 02/18/24 0947 02/19/24 0417 02/20/24 0416 02/22/24 0115 02/22/24 0359 02/22/24 0411  WBC 4.9 3.3* 3.5*  --   --  12.4*  CREATININE 1.06 0.96 0.87 4.28*  --   --   LATICACIDVEN  --   --   --  >9.0* >9.0*  --     Estimated Creatinine Clearance: 13.5 mL/min (A) (by C-G formula based on SCr of 4.28 mg/dL (H)).    No Known Allergies  Antimicrobials this admission: 3/21 Cefepime >> x 1 dose 3/21 Flagyl >> x 1 dose 3/21 Zosyn >>  3/21 Vancomycin >>  Microbiology results: 3/21 BCx: Pending  Thank you for allowing pharmacy to be a part of this patient's care.  Otelia Sergeant, PharmD, [facility] 02/22/2024 6:15 AM

## 2024-02-22 NOTE — Sepsis Progress Note (Signed)
 Notified bedside nurse of need to draw repeat lactic acid.  Secure chat sent to bedside RN to verify reason for delay in antibiotics; d/t difficulty w/access, central line needed.

## 2024-02-22 NOTE — Progress Notes (Signed)
Patient extubated to comfort care

## 2024-02-22 NOTE — Progress Notes (Signed)
 CODE SEPSIS - PHARMACY COMMUNICATION  **Broad Spectrum Antibiotics should be administered within 1 hour of Sepsis diagnosis**  Time Code Sepsis Called/Page Received: 0117  Antibiotics Ordered: Cefepime, Flagyl, Vancomycin  Time of 1st antibiotic administration: 0313  Pt actively seizing, intubated, & ordered STAT Kcentra (Eliquis) and limited IV access delayed start of Abx.  Otelia Sergeant, PharmD, MBA 02/22/2024 1:51 AM

## 2024-02-22 NOTE — ED Provider Notes (Signed)
 [facility] Provider Note    Event Date/Time   First MD Initiated Contact with Patient 02/22/24 0109     (approximate)   History   Urinary Retention and Weakness   HPI  George Santos is a 67 y.o. male with history of stage III rectal cancer followed by [facility] on radiation and chemotherapy (diagnosed Jan 2025), coronary artery disease status post CABG x 1V, hypertension, hyperlipidemia, peripheral arterial disease status post recent admission for acute limb ischemia 02/18/24 - 02/20/24 status post mechanical thrombectomy on Eliquis and aspirin and prior aortic stent in May 2023, tobacco use who presents to the emergency department with altered mental status.  Patient is unable to provide any history.  History is provided by patient's son, wife and sister.  Patient's wife reports he was not acting normally today.  She is unable to tell me exactly when this started.  She reports since discharge he has not been eating and drinking well.   Has history of chronic diarrhea.  Son reports also possible vomiting.  No known falls, head injury.  No drug or alcohol use.  Wife reports occasional cough but no known fevers.  Patient brought in by EMS.  EMS reports initially blood pressure was in the 120s systolics and patient was talking.  On my evaluation patient is hypotensive and unresponsive.  History provided by wife, sister, son.    Past Medical History:  Diagnosis Date   Aortic atherosclerosis (HCC)    Arthritis    Coronary artery disease    a.) LHC --> 100% pLAD; transferred to [facility]. b.) 1v MIDCABG 09/07/2011   Diverticulosis    History of hiatal hernia    HLD (hyperlipidemia)    HTN (hypertension)    Long term current use of antithrombotics/antiplatelets    a.) on daily DAPT therapy (ASA + clopidogrel)   Non-ST elevation MI (NSTEMI) (HCC) 09/01/2011   a.) LHC --> 100% pLAD; transferred to [facility]. b.) 1v MIDCABG (LIMA-LAD) 09/07/2011 via mid-LEFT thoracotomy    Peripheral vascular disease (HCC)    S/P CABG x 1 09/07/2011   a.) 1v (LIMA-LAD) MIDCABG at [facility]    Past Surgical History:  Procedure Laterality Date   CARDIAC CATHETERIZATION  09/04/2011   CORONARY ARTERY BYPASS GRAFT N/A 09/07/2011   Procedure: CORONARY ARTERY BYPASS GRAFT (1v mini left anterior thoracotomy approach; LIMA-LAD); Location: [facility]; Surgeon: Clent Jacks, MD   ENDOVASCULAR REPAIR/STENT GRAFT N/A 04/26/2022   Procedure: ENDOVASCULAR REPAIR/STENT GRAFT;  Surgeon: Annice Needy, MD;  Location: [facility] INVASIVE CV LAB;  Service: Cardiovascular;  Laterality: N/A;   HIATAL HERNIA REPAIR     LOWER EXTREMITY ANGIOGRAPHY Right 02/20/2022   Procedure: Lower Extremity Angiography;  Surgeon: Annice Needy, MD;  Location: [facility] INVASIVE CV LAB;  Service: Cardiovascular;  Laterality: Right;   LOWER EXTREMITY ANGIOGRAPHY Right 02/18/2024   Procedure: Lower Extremity Angiography;  Surgeon: Annice Needy, MD;  Location: [facility] INVASIVE CV LAB;  Service: Cardiovascular;  Laterality: Right;    MEDICATIONS:  Prior to Admission medications   Medication Sig Start Date End Date Taking? Authorizing Provider  acetaminophen (TYLENOL) 325 MG tablet Take 650 mg by mouth every 6 (six) hours as needed.    [provider]  APIXABAN Everlene Balls) VTE STARTER PACK (10MG  AND 5MG ) Take as directed on package: start with two-5mg  tablets twice daily for 7 days. On day 8, switch to one-5mg  tablet twice daily. 02/19/24   Alford Highland, MD  aspirin EC 81 MG tablet Take 81  mg by mouth daily. Swallow whole.    [provider]  atorvastatin (LIPITOR) 10 MG tablet Take 1 tablet (10 mg total) by mouth daily. 02/21/24 02/20/25  Annice Needy, MD  capecitabine (XELODA) 500 MG tablet Take by mouth.  Take 3 tablets (1,500 mg total) by mouth every twelve (12) hours . Only take on radiation treatment days (Monday - Friday). Take morning dose before each radiation treatment. Take with full glass of water after a meal, within  30 minutes 01/11/24   [provider]  diphenoxylate-atropine (LOMOTIL) 2.5-0.025 MG tablet Take 1 tablet by mouth 3 (three) times daily as needed for diarrhea or loose stools. 02/19/24   Alford Highland, MD  oxyCODONE (OXY IR/ROXICODONE) 5 MG immediate release tablet Take 10 mg by mouth every 6 (six) hours as needed.  Take 2 tablets (10 mg total) by mouth every six (6) hours as needed for pain for up to 28 days. 02/13/24 03/12/24  [provider]  oxyCODONE-acetaminophen (PERCOCET/ROXICET) 5-325 MG tablet Take 1-2 tablets by mouth every 4 (four) hours as needed for moderate pain. 04/27/22   Georgiana Spinner, NP    Physical Exam   Triage Vital Signs: ED Triage Vitals  Encounter Vitals Group     BP 02/22/24 0100 (!) 73/54     Systolic BP Percentile --      Diastolic BP Percentile --      Pulse Rate 02/22/24 0028 96     Resp 02/22/24 0028 20     Temp --      Temp Source 02/22/24 0028 Oral     SpO2 --      Weight 02/22/24 0027 125 lb 10.6 oz (57 kg)     Height 02/22/24 0027 5\' 9"  (1.753 m)     Head Circumference --      Peak Flow --      Pain Score 02/22/24 0026 10     Pain Loc --      Pain Education --      Exclude from Growth Chart --     Most recent vital signs: Vitals:   02/22/24 0815 02/22/24 0830  BP: 96/67 (!) 76/51  Pulse: (!) 109 (!) 108  Resp: (!) 27 (!) 27  Temp: (!) 100.6 F (38.1 C) (!) 105.8 F (41 C)  SpO2: 100% 100%    CONSTITUTIONAL: Patient's eyes are open but he is unresponsive.  GCS 6. HEAD: Normocephalic, atraumatic EYES: Conjunctivae clear, pupils are dilated to 8 mm bilaterally and nonreactive ENT: normal nose; moist mucous membranes NECK: No lymphadenopathy, trachea midline, no thyromegaly, no midline step-off or deformity CARD: Regular and tachycardic; S1 and S2 appreciated RESP: Patient is tachypneic, breath sounds clear bilaterally, shallow respirations ABD/GI: Non-distended; soft BACK: The back appears normal EXT: Extremities are  extremely cool to touch, mottled SKIN: Patient is extremely cool to touch and diffusely mottled NEURO: Patient has his eyes open but does not answer questions, follows commands.  Intermittently has increased tone and flexion of the left upper extremity.    ED Results / Procedures / Treatments   LABS: (all labs ordered are listed, but only abnormal results are displayed) Labs Reviewed  LACTIC ACID, PLASMA - Abnormal; Notable for the following components:      Result Value   Lactic Acid, Venous >9.0 (*)    All other components within normal limits  LACTIC ACID, PLASMA - Abnormal; Notable for the following components:   Lactic Acid, Venous >9.0 (*)    All  other components within normal limits  COMPREHENSIVE METABOLIC PANEL - Abnormal; Notable for the following components:   Sodium 133 (*)    Chloride 88 (*)    CO2 11 (*)    Glucose, Bld 158 (*)    BUN 51 (*)    Creatinine, Ser 4.28 (*)    Albumin 2.6 (*)    AST 48 (*)    GFR, Estimated 14 (*)    Anion gap 34 (*)    All other components within normal limits  PROTIME-INR - Abnormal; Notable for the following components:   Prothrombin Time 84.5 (*)    INR >10.0 (*)    All other components within normal limits  URINALYSIS, W/ REFLEX TO CULTURE (INFECTION SUSPECTED) - Abnormal; Notable for the following components:   Color, Urine YELLOW (*)    APPearance CLOUDY (*)    Glucose, UA 50 (*)    Hgb urine dipstick LARGE (*)    Protein, ur 100 (*)    Bacteria, UA RARE (*)    All other components within normal limits  BLOOD GAS, ARTERIAL - Abnormal; Notable for the following components:   pH, Arterial <6.95 (*)    Bicarbonate 7.5 (*)    Acid-base deficit 26.3 (*)    All other components within normal limits  CBC WITH DIFFERENTIAL/PLATELET - Abnormal; Notable for the following components:   WBC 12.4 (*)    RBC 3.97 (*)    Hemoglobin 10.1 (*)    HCT 32.5 (*)    MCH 25.4 (*)    RDW 24.3 (*)    nRBC 1.9 (*)    Neutro Abs 9.6 (*)     Lymphs Abs 0.4 (*)    Monocytes Absolute 1.1 (*)    Abs Immature Granulocytes 0.90 (*)    All other components within normal limits  BLOOD GAS, ARTERIAL - Abnormal; Notable for the following components:   pH, Arterial 7.21 (*)    pO2, Arterial 185 (*)    Bicarbonate 18.7 (*)    Acid-base deficit 9.6 (*)    All other components within normal limits  MAGNESIUM - Abnormal; Notable for the following components:   Magnesium 3.2 (*)    All other components within normal limits  PHOSPHORUS - Abnormal; Notable for the following components:   Phosphorus 11.8 (*)    All other components within normal limits  GLUCOSE, CAPILLARY - Abnormal; Notable for the following components:   Glucose-Capillary 224 (*)    All other components within normal limits  GLUCOSE, CAPILLARY - Abnormal; Notable for the following components:   Glucose-Capillary 287 (*)    All other components within normal limits  TROPONIN I (HIGH SENSITIVITY) - Abnormal; Notable for the following components:   Troponin I (High Sensitivity) 144 (*)    All other components within normal limits  TROPONIN I (HIGH SENSITIVITY) - Abnormal; Notable for the following components:   Troponin I (High Sensitivity) 234 (*)    All other components within normal limits  TROPONIN I (HIGH SENSITIVITY) - Abnormal; Notable for the following components:   Troponin I (High Sensitivity) 290 (*)    All other components within normal limits  CULTURE, BLOOD (ROUTINE X 2)  RESP PANEL BY RT-PCR (RSV, FLU A&B, COVID)  RVPGX2  MRSA NEXT GEN BY PCR, NASAL  CULTURE, BLOOD (ROUTINE X 2) W REFLEX TO ID PANEL  URINE DRUG SCREEN, QUALITATIVE ([facility] ONLY)  PROCALCITONIN  CK  CBC WITH DIFFERENTIAL/PLATELET  AMMONIA  FIBRINOGEN  FIBRINOGEN  BASIC METABOLIC PANEL  LACTIC ACID, PLASMA  LACTIC ACID, PLASMA  PROTIME-INR  CBG MONITORING, ED  TROPONIN I (HIGH SENSITIVITY)     EKG:  EKG Interpretation Date/Time:  Friday February 22 2024 03:45:13  EDT Ventricular Rate:  109 PR Interval:  112 QRS Duration:  130 QT Interval:  316 QTC Calculation: 426 R Axis:   42  Text Interpretation: Sinus tachycardia Nonspecific intraventricular conduction delay Probable anteroseptal infarct, old Minimal ST depression, lateral leads Borderline ST elevation, inferior leads Confirmed by UNCONFIRMED, DOCTOR ([id number]), editor Lonell Face [phone]) on 02/22/2024 7:47:27 AM         RADIOLOGY: My personal review and interpretation of imaging: CT head unremarkable.  CT of the chest, abdomen pelvis shows possible bowel obstruction.  I have personally reviewed all radiology reports.   CT Angio Chest/Abd/Pel for Dissection W and/or Wo Contrast Result Date: 02/22/2024 CLINICAL DATA:  Acute aortic syndrome suspected. Lower abdominal and bladder pain. Unable to void tonight. Weakness in the legs. Labored breathing. DVT diagnosis are or this week started on Eliquis. EXAM: CT ANGIOGRAPHY CHEST, ABDOMEN AND PELVIS TECHNIQUE: Non-contrast CT of the chest was initially obtained. Multidetector CT imaging through the chest, abdomen and pelvis was performed using the standard protocol during bolus administration of intravenous contrast. Multiplanar reconstructed images and MIPs were obtained and reviewed to evaluate the vascular anatomy. RADIATION DOSE REDUCTION: This exam was performed according to the departmental dose-optimization program which includes automated exposure control, adjustment of the mA and/or kV according to patient size and/or use of iterative reconstruction technique. CONTRAST:  OMNIPAQUE IOHEXOL 350 MG/ML SOLN COMPARISON:  Same day radiographs and CT 04/06/2022 FINDINGS: CTA CHEST FINDINGS Cardiovascular: No intramural hematoma, penetrating atherosclerotic ulcer, aneurysm or dissection in the thoracic aorta. Irregular predominantly noncalcified atherosclerotic plaque throughout the aorta. No pericardial effusion. Mediastinum/Nodes: Endotracheal tube tip in  the intrathoracic trachea. Enteric tube tip in the stomach. Fluid column throughout the esophagus reaching above the thoracic inlet. No lymphadenopathy. Lungs/Pleura: Emphysema. Bronchial wall thickening and mucous plugging in the lower lobes. No focal consolidation, pleural effusion, or pneumothorax. Musculoskeletal: No acute fracture. Review of the MIP images confirms the above findings. CTA ABDOMEN AND PELVIS FINDINGS VASCULAR Aorta: Aorto bi-iliac endovascular stent is patent. No aneurysm or dissection. Irregular atherosclerotic plaque in the abdominal aorta. Celiac: Patent without aneurysm or dissection. Question wall thickening about the celiac axis and its branch arteries with moderate to severe narrowing of the branch arteries. SMA: Patent without aneurysm or dissection. Question wall thickening about the distal branches of the SMA. Renals: Patent without aneurysm or dissection. Question wall thickening about the distal renal arteries bilaterally and causing moderate to severe narrowing. IMA: Occluded. Inflow: Moderate narrowing in the left common iliac artery stent. Severe narrowing in the left internal and external iliac arteries and left common femoral artery. Question wall thickening about the left external iliac artery. Severe narrowing in the right external iliac artery distal to the stent. Patent common femoral artery stent on the right. Veins: No obvious venous abnormality within the limitations of this arterial phase study. Review of the MIP images confirms the above findings. NON-VASCULAR Hepatobiliary: No acute abnormality. Pancreas: Unremarkable. Spleen: Unremarkable. Adrenals/Urinary Tract: Normal adrenal glands. Geographic hypoattenuation within the posterior left kidney and medial right kidney suspicious for infarcts. Primary differential consideration is pyelonephritis. No urinary calculi or hydronephrosis. Unremarkable bladder. Stomach/Bowel: Distended stomach. Marked dilation of the small  bowel. There may be a transition point in the low anterior abdomen (series 8/image 45) however the small bowel  downstream from this is mildly distended with fluid. Question mild wall thickening of the small bowel loops in the pelvis. There is fluid in the right colon. Normal caliber colon without wall thickening. Lymphatic: No lymphadenopathy. Reproductive: No acute abnormality. Small amount of free fluid in the pelvis. No free intraperitoneal air. Free intraperitoneal fluid or air. Musculoskeletal: No acute fracture. Review of the MIP images confirms the above findings. IMPRESSION: 1. Possible wall thickening about multiple mesenteric, renal, and iliac artery branches causing areas of moderate and severe narrowing. This is suggestive of a medium/small vessel vasculitis. 2. Marked dilation of the small bowel with possible transition point in the low anterior abdomen. The small bowel downstream from this is mildly distended with fluid. Question mild wall thickening of the small bowel loops in the pelvis. Differential considerations include ileus secondary to chronic mesenteric ischemia versus enteritis versus mechanical obstruction 3. Geographic hypoattenuation within the posterior left kidney suspicious for infarct. Primary differential consideration is pyelonephritis. 4. Bronchial wall thickening and mucous plugging in the lower lobes. Likely secondary to aspiration given fluid column extending above the thoracic inlet. Electronically Signed   By: Minerva Fester M.D.   On: 02/22/2024 03:25   CT HEAD WO CONTRAST ( ) Result Date: 02/22/2024 CLINICAL DATA:  Mental status change, unknown cause EXAM: CT HEAD WITHOUT CONTRAST TECHNIQUE: Contiguous axial images were obtained from the base of the skull through the vertex without intravenous contrast. RADIATION DOSE REDUCTION: This exam was performed according to the departmental dose-optimization program which includes automated exposure control, adjustment of the mA  and/or kV according to patient size and/or use of iterative reconstruction technique. COMPARISON:  CT head 09/01/2011 FINDINGS: Brain: No intracranial hemorrhage, mass effect, or evidence of acute infarct. No hydrocephalus. No extra-axial fluid collection. Vascular: No hyperdense vessel or unexpected calcification. Skull: No fracture or focal lesion. Sinuses/Orbits: Mucosal thickening in the right maxillary sinus with air-fluid level. The paranasal sinuses and mastoid air cells are otherwise well aerated. Other: None. IMPRESSION: 1. No acute intracranial abnormality. 2. Right mucosal thickening and air-fluid level. Correlate for acute sinusitis. Electronically Signed   By: Minerva Fester M.D.   On: 02/22/2024 03:02   DG Abd Portable 1 View Result Date: 02/22/2024 CLINICAL DATA:  OG tube placement EXAM: PORTABLE ABDOMEN - 1 VIEW COMPARISON:  None Available. FINDINGS: Enteric tube side port in the stomach and tip near the pylorus duodenal junction. Right femoral catheter. Dilated loops of small bowel in the central abdomen. Endovascular aorto bi-iliac stent. IMPRESSION: Enteric tube side port in the stomach and tip near the pylorus duodenal junction. Dilated loops of small bowel in the central abdomen. Correlate with CT for obstruction versus ileus. Electronically Signed   By: Minerva Fester M.D.   On: 02/22/2024 02:45   DG Chest Portable 1 View Result Date: 02/22/2024 CLINICAL DATA:  Check ETT placement post intubation EXAM: PORTABLE CHEST 1 VIEW COMPARISON:  02/22/2024 at 12:43 a.m. FINDINGS: Endotracheal tube tip in the intrathoracic trachea 3.7 cm from the carina. Subdiaphragmatic enteric tube with tip near the pylorus duodenal junction. Stable cardiomediastinal silhouette. No focal consolidation, pleural effusion, or pneumothorax. No displaced rib fractures. IMPRESSION: Endotracheal tube tip in the intrathoracic trachea 3.7 cm from the carina. Electronically Signed   By: Minerva Fester M.D.   On:  02/22/2024 02:42   DG Chest Port 1 View Result Date: 02/22/2024 CLINICAL DATA:  Questionable sepsis - evaluate for abnormality EXAM: PORTABLE CHEST 1 VIEW COMPARISON:  09/01/2011 FINDINGS: Heart and mediastinal contours within  normal limits. Increased markings in the lung bases. No effusions. No acute bony abnormality. IMPRESSION: Increased markings in the lung bases could reflect atelectasis or early infiltrates. Electronically Signed   By: Charlett Nose M.D.   On: 02/22/2024 00:54     PROCEDURES:  Critical Care performed: Yes, see critical care procedure note(s)   CRITICAL CARE Performed by: Rochele Raring   Total critical care time: 95 minutes  Critical care time was exclusive of separately billable procedures and treating other patients.  Critical care was necessary to treat or prevent imminent or life-threatening deterioration.  Critical care was time spent personally by me on the following activities: development of treatment plan with patient and/or surrogate as well as nursing, discussions with consultants, evaluation of patient's response to treatment, examination of patient, obtaining history from patient or surrogate, ordering and performing treatments and interventions, ordering and review of laboratory studies, ordering and review of radiographic studies, pulse oximetry and re-evaluation of patient's condition.   Marland Kitchen1-3 Lead EKG Interpretation  Performed by: Bettylee Feig, Layla Maw, DO Authorized by: Jaxin Fulfer, Layla Maw, DO     Interpretation: abnormal     ECG rate:  114   ECG rate assessment: tachycardic     Rhythm: sinus tachycardia     Ectopy: none     Conduction: normal   Central Line  Date/Time: 02/22/2024 2:30 AM  Performed by: Analy Bassford, Layla Maw, DO Authorized by: Margaret Cockerill, Layla Maw, DO   Consent:    Consent obtained:  Emergent situation   Consent given by:  Spouse   Risks, benefits, and alternatives were discussed: yes     Risks discussed:  Arterial puncture, bleeding,  infection, incorrect placement and nerve damage   Alternatives discussed:  Alternative treatment Universal protocol:    Procedure explained and questions answered to patient or proxy's satisfaction: yes     Relevant documents present and verified: yes     Test results available: yes     Imaging studies available: yes     Required blood products, implants, devices, and special equipment available: yes     Site/side marked: yes     Immediately prior to procedure, a time out was called: yes     Patient identity confirmed:  Arm band Pre-procedure details:    Indication(s): central venous access and insufficient peripheral access     Hand hygiene: Hand hygiene performed prior to insertion     Sterile barrier technique: All elements of maximal sterile technique followed     Skin preparation:  Chlorhexidine   Skin preparation agent: Skin preparation agent completely dried prior to procedure   Sedation:    Sedation type:  None Anesthesia:    Anesthesia method:  None Procedure details:    Location:  R femoral   Site selection rationale:  Emergent   Patient position:  Supine   Procedural supplies:  Triple lumen   Landmarks identified: yes     Ultrasound guidance: yes     Ultrasound guidance timing: prior to insertion and real time     Sterile ultrasound techniques: Sterile gel and sterile probe covers were used     Number of attempts:  1   Successful placement: yes   Post-procedure details:    Post-procedure:  Dressing applied and line sutured   Assessment:  Blood return through all ports and free fluid flow   Procedure completion:  Tolerated well, no immediate complications  INTUBATION Performed by: Rochele Raring  Required items: required blood products, implants, devices, and special equipment  available Patient identity confirmed: provided demographic data and hospital-assigned identification number Time out: Immediately prior to procedure a "time out" was called to verify the  correct patient, procedure, equipment, support staff and site/side marked as required.  Indications: Septic shock, altered mental status, GCS 6  Intubation method: Glidescope Laryngoscopy   Preoxygenation: BVM  Sedatives: 57 mg IV ketamine Paralytic: 57 mg IV rocuronium  Tube Size: 7.5 cuffed  Post-procedure assessment: chest rise and ETCO2 monitor Breath sounds: equal and absent over the epigastrium Tube secured with: ETT holder Chest x-ray interpreted by radiologist and me.  Chest x-ray findings: endotracheal tube in appropriate position  Patient tolerated the procedure well with no immediate complications.     IMPRESSION / MDM / ASSESSMENT AND PLAN / ED COURSE  I reviewed the triage vital signs and the nursing notes.    Patient here with altered mental status, hypotension.  The patient is on the cardiac monitor to evaluate for evidence of arrhythmia and/or significant heart rate changes.   DIFFERENTIAL DIAGNOSIS (includes but not limited to):   Septic shock, cardiogenic shock, dehydration, blood loss, multisystem organ failure, stroke, intracranial hemorrhage, herniation, metastatic cancer   Patient's presentation is most consistent with acute presentation with potential threat to life or bodily function.   PLAN: Patient is critically ill.  He is extremely hypotensive, mottled, unresponsive with fixed and dilated pupils.  Intermittently patient will have flexion, increased tone of the left upper extremity concerning for seizure versus decorticate posturing.  I am concerned he may be herniating.  Will emergently give IV mannitol, reverse his Eliquis with Kcentra, give benzodiazepines and start Keppra.  CBG 75.  Patient will need intubation due to his critical status and for airway protection given GCS of 6.  Blood pressure currently in the 50s systolic.  Patient receiving 3 L of IV fluids and will start Levophed.  Will also start broad-spectrum antibiotics.  Bedside  ultrasound reveals diminished ejection fraction and left ventricular hypertrophy but no pericardial effusion and low suspicion that this is all due to cardiogenic shock given he does have decent ejection fraction on bedside ultrasound.  EKG does not show new ischemic changes.  Family is not sure of his CODE STATUS at this time.  Will keep him a full code until son can discuss with his mother.  Once patient is more stable, will obtain CT of the head, chest, abdomen and pelvis.  Labs, urine, cultures also sent.   MEDICATIONS GIVEN IN ED: Medications  docusate (COLACE) 50 MG/5ML liquid 100 mg (has no administration in time range)  polyethylene glycol (MIRALAX / GLYCOLAX) packet 17 g (has no administration in time range)  docusate (COLACE) 50 MG/5ML liquid 100 mg (has no administration in time range)  polyethylene glycol (MIRALAX / GLYCOLAX) packet 17 g (has no administration in time range)  Oral care mouth rinse (15 mLs Mouth Rinse Given 02/22/24 0725)  Oral care mouth rinse (has no administration in time range)  fentaNYL (SUBLIMAZE) injection 25 mcg (has no administration in time range)  fentaNYL (SUBLIMAZE) injection 25-100 mcg (has no administration in time range)  midazolam (VERSED) injection 1-2 mg (has no administration in time range)  Chlorhexidine Gluconate Cloth 2 % PADS 6 each (has no administration in time range)  sodium bicarbonate 150 mEq in sterile water 1,150 mL infusion ( Intravenous Infusion Verify 02/22/24 0832)  vasopressin (PITRESSIN) 20 Units in 100 mL (0.2 unit/mL) infusion-*FOR SHOCK* (0 Units/min Intravenous Hold 02/22/24 0608)  norepinephrine (LEVOPHED) 16 mg in (  0.064 mg/mL) premix infusion (19 mcg/min Intravenous Infusion Verify 02/22/24 0832)  piperacillin-tazobactam (ZOSYN) IVPB 2.25 g (0 g Intravenous Stopped 02/22/24 0824)  vancomycin variable dose per unstable renal function (pharmacist dosing) (has no administration in time range)  vancomycin (VANCOREADY) IVPB  500 mg/100 mL (has no administration in time range)  mannitol 25 % injection 50 g (0 g Intravenous Stopped 02/22/24 0341)  lactated ringers bolus 1,000 mL (0 mLs Intravenous Stopped 02/22/24 0342)  ceFEPIme (MAXIPIME) 2 g in sodium chloride 0.9 % 100 mL IVPB (0 g Intravenous Stopped 02/22/24 0341)  metroNIDAZOLE (FLAGYL) IVPB 500 mg (0 mg Intravenous Stopped 02/22/24 0413)  vancomycin (VANCOCIN) IVPB 1000 mg/200 mL premix (0 mg Intravenous Stopping previously hung infusion 02/22/24 0550)  norepinephrine (LEVOPHED) 4-5 MG/250ML-% infusion SOLN (0 mcg/kg/min  Stopped 02/22/24 0413)  prothrombin complex conc human (KCENTRA) IVPB 3,000 Units (0 Units Intravenous Stopped 02/22/24 0341)  ketamine (KETALAR) 10 MG/ML injection (57 mg Intravenous Given 02/22/24 0127)  LORazepam (ATIVAN) injection 2 mg (2 mg Intravenous Given 02/22/24 0104)  levETIRAcetam (KEPPRA) IVPB 1000 mg/100 mL premix (0 mg Intravenous Stopped 02/22/24 0342)  sodium bicarbonate injection 150 mEq (150 mEq Intravenous Given 02/22/24 0200)  sodium chloride 0.9 % bolus 1,000 mL (0 mLs Intravenous Stopped 02/22/24 0342)  lactated ringers bolus 1,000 mL (0 mLs Intravenous Stopped 02/22/24 0342)  iohexol (OMNIPAQUE) 350 MG/ML injection 100 mL (100 mLs Intravenous Contrast Given 02/22/24 0258)  rocuronium (ZEMURON) injection 57 mg (57 mg Intravenous Given 02/22/24 0128)  PHENYLephrine 80 mcg/ml in normal saline Adult IV Push Syringe (For Blood Pressure Support) (160 mcg Intravenous Given 02/22/24 0128)  norepinephrine (LEVOPHED) 4-5 MG/250ML-% infusion SOLN (0 mcg/kg/min  Stopped 02/22/24 0600)  albumin human 25 % solution 25 g (0 g Intravenous Stopped 02/22/24 0724)     ED COURSE: Patient continued to have intermittent posturing of the left upper extremity and then of the right upper extremity as well despite Ativan.  This improved after receiving mannitol and then pupils were now 4 mm and reactive.    Patient was intubated using ketamine and rocuronium  but did not require sedation afterwards.  Blood pressure improving with IV Levophed and phenylephrine and IV fluids.  Central line was placed emergently for access for critical medications and to obtain additional blood work.  Once blood pressure improved, patient taken emergently to CT scan.  CT of the head reviewed and interpreted by myself and the radiologist and showed no acute abnormality, no herniation, no bleeding.  Patient not a code stroke given wife unable to tell us his last known well.  ABG shows significant metabolic acidosis likely from lactic acidosis secondary to severe hypotension, shock.  I did give him 3 amps of IV bicarb.  CT dissection study also reviewed and interpreted by myself and the radiologist and shows possible medium to small vessel vasculitis, ileus versus bowel obstruction versus enteritis versus chronic mesenteric ischemia. OG tube in place. There is also concern for infarct in the left kidney versus pyelonephritis.  Urine shows red blood cells, white blood cells and rare bacteria. Also signs of aspiration on CT of the chest.  COVID, flu, RSV negative.  Procalcitonin elevated at 38.  Patient's initial temperature was 91.8.  Patient placed under a Lawyer.  Creatinine significantly elevated at 4.28.  He did receive 3 L of fluids.  Troponin elevated at 144 likely from demand ischemia from septic shock.  INR > 10, possible DIC?Marland Kitchen  Patient on Eliquis.  No signs of bleeding  but did get Kcentra given my initial concerns for possible intracranial hemorrhage, herniation given his fixed and dilated pupils, posturing.  Critical care team [facility] Sydnee Levans Rust, NP) has seen patient, updated family.  They have also contacted Dr. Claudine Mouton with general surgery to see patient in consultation.     CONSULTS:  ICU consulted for admission.   OUTSIDE RECORDS REVIEWED: Reviewed patient's recent admission notes, oncology notes.       FINAL CLINICAL IMPRESSION(S) /  ED DIAGNOSES   Final diagnoses:  Altered mental status, unspecified altered mental status type  Lactic acidosis  Hypothermia, initial encounter  AKI (acute kidney injury) (HCC)  Septic shock (HCC)  Coagulopathy (HCC)  SBO (small bowel obstruction) (HCC)     Rx / DC Orders   ED Discharge Orders     None        Note:  This document was prepared using Dragon voice recognition software and may include unintentional dictation errors.   Stormy Sabol, Layla Maw, DO 02/22/24 [phone]

## 2024-02-22 NOTE — Consult Note (Signed)
 [facility] SURGICAL CONSULTATION NOTE (initial) - cpt: 08657   HISTORY OF PRESENT ILLNESS (HPI):  Patient unable to provide history secondary to intubation/AMS. History obtained through chart review, discussion with medical staff, and family at bedside  68 y.o. male presented to [facility] ED yesterday for altered mental status. Patient recently admitted from 03/17-03/19 secondary to limb ischemia requiring vascular surgery intervention. History is unclear as to what happened at home to prompt his visit. On chart review, it appears he found to be altered at home yesterday. Family at bedside believes he may have been vomiting at home. Reportedly did have abdominal pain as well. Further history limited. In the ED, he was found to be hypoxic, hypotensive and unresponsive. Initially ABG showed arterial pH <6.95. He was emergently intubated. Additional labs reviewed showed ARF with sCr - 4.28, venous lactate >9.0 x2, INR >10, Bcx negative, high sensitivity troponin 114 --> 234 --> 290 --> 691. He did have CT Head which was without acute finding. CTA Chest/Abdomen/Pelvis did show distension of stomach, small bowel, and proximal colon. Also concern for possible aspiration. He was admitted to the ICU. He is currently intubated and on Levophed 19 mcg/min. Also on Zosyn.   He does have significant PMHx including newly diagnosed rectal cancer on chemotherapy/radiation [facility]), coronary artery disease status post CABG x 1V, hypertension, hyperlipidemia, peripheral arterial disease on Eliquis.   Surgery is consulted by [facility] provider Birtton Rust-Chester, NP in this context for evaluation and management of possible ileus vs SBO.  PAST MEDICAL HISTORY (PMH):  Past Medical History:  Diagnosis Date   Aortic atherosclerosis (HCC)    Arthritis    Coronary artery disease    a.) LHC --> 100% pLAD; transferred to [facility]. b.) 1v MIDCABG 09/07/2011   Diverticulosis    History of hiatal hernia    HLD  (hyperlipidemia)    HTN (hypertension)    Long term current use of antithrombotics/antiplatelets    a.) on daily DAPT therapy (ASA + clopidogrel)   Non-ST elevation MI (NSTEMI) (HCC) 09/01/2011   a.) LHC --> 100% pLAD; transferred to [facility]. b.) 1v MIDCABG (LIMA-LAD) 09/07/2011 via mid-LEFT thoracotomy   Peripheral vascular disease (HCC)    S/P CABG x 1 09/07/2011   a.) 1v (LIMA-LAD) MIDCABG at [facility]     PAST SURGICAL HISTORY [facility]):  Past Surgical History:  Procedure Laterality Date   CARDIAC CATHETERIZATION  09/04/2011   CORONARY ARTERY BYPASS GRAFT N/A 09/07/2011   Procedure: CORONARY ARTERY BYPASS GRAFT (1v mini left anterior thoracotomy approach; LIMA-LAD); Location: [facility]; Surgeon: Clent Jacks, MD   ENDOVASCULAR REPAIR/STENT GRAFT N/A 04/26/2022   Procedure: ENDOVASCULAR REPAIR/STENT GRAFT;  Surgeon: Annice Needy, MD;  Location: [facility] INVASIVE CV LAB;  Service: Cardiovascular;  Laterality: N/A;   HIATAL HERNIA REPAIR     LOWER EXTREMITY ANGIOGRAPHY Right 02/20/2022   Procedure: Lower Extremity Angiography;  Surgeon: Annice Needy, MD;  Location: [facility] INVASIVE CV LAB;  Service: Cardiovascular;  Laterality: Right;   LOWER EXTREMITY ANGIOGRAPHY Right 02/18/2024   Procedure: Lower Extremity Angiography;  Surgeon: Annice Needy, MD;  Location: [facility] INVASIVE CV LAB;  Service: Cardiovascular;  Laterality: Right;     MEDICATIONS:  Prior to Admission medications   Medication Sig Start Date End Date Taking? Authorizing Provider  acetaminophen (TYLENOL) 325 MG tablet Take 650 mg by mouth every 6 (six) hours as needed.    [provider]  APIXABAN Everlene Balls) VTE STARTER PACK (10MG  AND 5MG ) Take as directed on package: start with  two-5mg  tablets twice daily for 7 days. On day 8, switch to one-5mg  tablet twice daily. 02/19/24   Alford Highland, MD  aspirin EC 81 MG tablet Take 81 mg by mouth daily. Swallow whole.    [provider]  atorvastatin (LIPITOR) 10 MG tablet Take 1 tablet  (10 mg total) by mouth daily. 02/21/24 02/20/25  Annice Needy, MD  capecitabine (XELODA) 500 MG tablet Take by mouth.  Take 3 tablets (1,500 mg total) by mouth every twelve (12) hours . Only take on radiation treatment days (Monday - Friday). Take morning dose before each radiation treatment. Take with full glass of water after a meal, within 30 minutes 01/11/24   [provider]  diphenoxylate-atropine (LOMOTIL) 2.5-0.025 MG tablet Take 1 tablet by mouth 3 (three) times daily as needed for diarrhea or loose stools. 02/19/24   Alford Highland, MD  oxyCODONE (OXY IR/ROXICODONE) 5 MG immediate release tablet Take 10 mg by mouth every 6 (six) hours as needed.  Take 2 tablets (10 mg total) by mouth every six (6) hours as needed for pain for up to 28 days. 02/13/24 03/12/24  [provider]  oxyCODONE-acetaminophen (PERCOCET/ROXICET) 5-325 MG tablet Take 1-2 tablets by mouth every 4 (four) hours as needed for moderate pain. 04/27/22   Georgiana Spinner, NP     ALLERGIES:  No Known Allergies   SOCIAL HISTORY:  Social History   Socioeconomic History   Marital status: Married    Spouse name: Virl Axe   Number of children: 5   Years of education: Not on file   Highest education level: Not on file  Occupational History   Not on file  Tobacco Use   Smoking status: Every Day    Current packs/day: 0.50    Average packs/day: 0.5 packs/day for 45.0 years (22.5 ttl pk-yrs)    Types: Cigarettes   Smokeless tobacco: Never  Vaping Use   Vaping status: Never Used  Substance and Sexual Activity   Alcohol use: Not Currently   Drug use: Never   Sexual activity: Yes  Other Topics Concern   Not on file  Social History Narrative   Lives at home wife Virl Axe    Social Drivers of Health   Financial Resource Strain: Medium Risk (12/20/2023)   Received from [facility]   Overall Financial Resource Strain (CARDIA)    Difficulty of Paying Living Expenses: Somewhat hard  Food Insecurity: No Food  Insecurity (02/18/2024)   Hunger Vital Sign    Worried About Running Out of Food in the Last Year: Never true    Ran Out of Food in the Last Year: Never true  Transportation Needs: No Transportation Needs (02/18/2024)   PRAPARE - Administrator, Civil Service (Medical): No    Lack of Transportation (Non-Medical): No  Physical Activity: Not on file  Stress: Not on file  Social Connections: Moderately Isolated (02/18/2024)   Social Connection and Isolation Panel [NHANES]    Frequency of Communication with Friends and Family: More than three times a week    Frequency of Social Gatherings with Friends and Family: Twice a week    Attends Religious Services: Never    Database administrator or Organizations: No    Attends Banker Meetings: Never    Marital Status: Married  Catering manager Violence: Not At Risk (02/18/2024)   Humiliation, Afraid, Rape, and Kick questionnaire    Fear of Current or Ex-Partner: No    Emotionally Abused: No  Physically Abused: No    Sexually Abused: No     FAMILY HISTORY:  History reviewed. No pertinent family history.    REVIEW OF SYSTEMS:  Review of Systems  Unable to perform ROS: Critical illness    VITAL SIGNS:  Temp:  [91.8 F (33.2 C)-98.1 F (36.7 C)] 98.1 F (36.7 C) (03/21 0645) Pulse Rate:  [36-118] 114 (03/21 0600) Resp:  [18-37] 18 (03/21 0645) BP: (45-157)/(16-98) 90/68 (03/21 0645) SpO2:  [100 %] 100 % (03/21 0600) FiO2 (%):  [60 %-100 %] 60 % (03/21 0515) Weight:  [55.7 kg-57 kg] 55.7 kg (03/21 0500)     Height: 5\' 9"  (175.3 cm) Weight: 55.7 kg BMI (Calculated): 18.13   INTAKE/OUTPUT:  03/20 0701 - 03/21 0700 In: 4674 [I.V.:785.2; IV Piggyback:3888.8] Out: 1750 [Urine:300; Emesis/NG output:1450]  PHYSICAL EXAM:  Physical Exam Vitals and nursing note reviewed. Exam conducted with a chaperone present.  Constitutional:      Comments: Patient intubated, family at bedside   HENT:     Head: Normocephalic  and atraumatic.  Cardiovascular:     Rate and Rhythm: Tachycardia present.     Pulses: Normal pulses.  Pulmonary:     Comments: On ventilator  Abdominal:     General: Abdomen is flat. There is no distension.     Palpations: Abdomen is soft.     Comments: Abdomen is soft, non-distended, unable to assess for tenderness, scar in epigastrium  Musculoskeletal:     Right lower leg: No edema.     Left lower leg: No edema.  Skin:    General: Skin is dry.     Coloration: Skin is pale.     Findings: No erythema.  Neurological:     Comments: Unable to reliably assess  Psychiatric:     Comments: Unable to reliably assess      Labs:     Latest Ref Rng & Units 02/22/2024    4:11 AM 02/20/2024    4:16 AM 02/19/2024    4:17 AM  CBC  WBC 4.0 - 10.5 K/uL 12.4  3.5  3.3   Hemoglobin 13.0 - 17.0 g/dL 91.4  78.2  95.6   Hematocrit 39.0 - 52.0 % 32.5  32.7  33.3   Platelets 150 - 400 K/uL 299  259  242       Latest Ref Rng & Units 02/22/2024    1:15 AM 02/20/2024    4:16 AM 02/19/2024    4:17 AM  CMP  Glucose 70 - 99 mg/dL 213  086  578   BUN 8 - 23 mg/dL 51  19  17   Creatinine 0.61 - 1.24 mg/dL 4.69  6.29  5.28   Sodium 135 - 145 mmol/L 133  130  131   Potassium 3.5 - 5.1 mmol/L 3.8  3.4  3.7   Chloride 98 - 111 mmol/L 88  101  102   CO2 22 - 32 mmol/L 11  19  21    Calcium 8.9 - 10.3 mg/dL 9.2  8.1  7.9   Total Protein 6.5 - 8.1 g/dL 6.6   5.5   Total Bilirubin 0.0 - 1.2 mg/dL 0.7   0.6   Alkaline Phos 38 - 126 U/L 52   31   AST 15 - 41 U/L 48   71   ALT 0 - 44 U/L 29   23     Imaging studies:   CTA Chest/Abdomen/Pelvis (02/22/2024) personally reviewed with dilation of stomach and small bowel, no  gross transition, fluid in colon noted as well, no pneumoperitoneum, no pneumatosis, and radiologist report reviewed below:  IMPRESSION: 1. Possible wall thickening about multiple mesenteric, renal, and iliac artery branches causing areas of moderate and severe narrowing. This is  suggestive of a medium/small vessel vasculitis. 2. Marked dilation of the small bowel with possible transition point in the low anterior abdomen. The small bowel downstream from this is mildly distended with fluid. Question mild wall thickening of the small bowel loops in the pelvis. Differential considerations include ileus secondary to chronic mesenteric ischemia versus enteritis versus mechanical obstruction 3. Geographic hypoattenuation within the posterior left kidney suspicious for infarct. Primary differential consideration is pyelonephritis. 4. Bronchial wall thickening and mucous plugging in the lower lobes. Likely secondary to aspiration given fluid column extending above the thoracic inlet.   Assessment/Plan:  68 y.o. critically ill male requiring vasopressor and ventilator support found to have MSOF and shock, question aspiration, also with dilated small bowel/stomach   - Unfortunately, this is a rather difficult situation and he is quite sick. I do think his noted small bowel and stomach dilation on imaging represents illness secondary to acute medical illness/shock. He is in MSOF requiring ventilator and vasopressor support. There is no evidence of pneumoperitoneum nor pneumatosis on imaging. Likely has chronic vascular changes as noted on CTA and given history. I do not think there is clear cut emergent indication for emergent surgical intervention, nor would he be a candidate in this setting. I had a long discussion regarding this with his family at bedside. For now, I agree with supportive care, decompression, Abx as ordered. Ventilator and vasopressor support per PCCM. Likely need to continue GOC discussion with patient's family. We will be happy to follow along.    All of the above findings and recommendations were discussed with the patient's family, and all of their questions were answered to their expressed satisfaction.  Thank you for the opportunity to participate in  this patient's care.   -- Lynden Oxford, PA-C  [facility] 02/22/2024, 7:12 AM M-F: 7am - 4pm

## 2024-02-22 NOTE — ED Notes (Addendum)
 Improvement in BP, team prepping for intubation. Still unable to obtain SpO2 since pt has been in ED.

## 2024-02-22 NOTE — Progress Notes (Signed)
 Pt extubated per order, assessed for signs of discomfort, pt given iv bolus pain med, vasopressors d/c after extubation, family at bedside updated.

## 2024-02-22 NOTE — IPAL (Signed)
  Interdisciplinary Goals of Care Family Meeting   Date carried out: 02/22/2024  Location of the meeting: Conference room  Member's involved: Nurse Practitioner and Family Member or next of kin  Durable Power of Attorney or Environmental health practitioner: Pt's wife, pt's 2 daughters and son, pt's father, multiple other family members    Discussion: We discussed goals of care for [name] .  We reviewed his chronic medical issues including rectal cancer on chemo/radiation and severe COPD.  They report he has declined in the past couple of months.  We reviewed his acute clinical course including Severe Sepsis with Septic shock due to aspiration pneumonia and possible intraabdominal source, AKI (possibly needing dialysis), Respiratory Failure requiring ventilator, elevated Troponin's which could be simply demand ischemia vs MI, and Acute Metabolic Encephalopathy.  Discussed that with his current critical illness superimposed on his chronic co-morbidities, overall long term prognosis is guarded and poor.  We discussed that we could continue to treat the treatable and see how he does over the next 24-48 hrs given his most recent renal function is slightly improved,  vs transitioning to comfort measures.  Family states he would NEVER WANT to be on the ventilator, would NEVER WANT dialysis, and they feel like he is suffering, and they do not want him to suffer any longer.  The family is in agreement and request that they would like to TRANSITION TO COMFORT MEASURES once all family has had time to visit.  They will notify the ICU team when they are ready to withdraw care and transition to comfort.  In the interim will continue with current supportive measures, with no escalation of care should he begin to decline.   Code status:   Code Status: Limited: Do not attempt resuscitation (DNR) -DNR-LIMITED -Do Not Intubate/DNI    Disposition: In-patient comfort care  Time spent for the meeting: 25  minutes    George Santos, AGACNP-BC [facility] Prefer epic messenger for cross cover needs If after hours, please call E-link  George Modest, NP  02/22/2024, 2:45 PM

## 2024-02-22 NOTE — Progress Notes (Signed)
 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  He was found to be in shock, intubated, and an acute kidney injury.  Initial concern for severe neurological decompensation-received mannitol for potential herniation.  Vital signs show tachycardia, saturating 79% on 60% FiO2 on the ventilator.  Currently on norepinephrine and phenylephrine infusions.  Results show severe metabolic acidosis, mild electrolyte disturbances, elevated creatinine and hypophosphatemia, lactic acidosis, leukocytosis and elevated INR in the setting of apixaban use.  Urinalysis grossly positive.  CT head unremarkable.  CT angiography of the chest abdomen pelvis fairly unremarkable.  eICU Interventions  Maintain empiric antibiotics  Adequately resuscitated with crystalloid/colloid, maintain vasopressors for MAP greater than 65  Continue bicarbonate infusion, will likely need renal replacement therapy.  DVT prophylaxis-consider heparin Consider famotidine for GI prophylaxis     Intervention Category Evaluation Type: New Patient Evaluation  Sobia Karger 02/22/2024, 6:02 AM

## 2024-02-22 NOTE — Progress Notes (Signed)
 Patient transported to CT and back to ED room while on ventilator, no complications.

## 2024-02-22 NOTE — Consult Note (Signed)
 PHARMACY CONSULT NOTE - ELECTROLYTES  Pharmacy Consult for Electrolyte Monitoring and Replacement   Recent Labs: Height: 5\' 9"  (175.3 cm) Weight: 55.7 kg (122 lb 12.7 oz) IBW/kg (Calculated) : 70.7 Estimated Creatinine Clearance: 16.7 mL/min (A) (by C-G formula based on SCr of 3.38 mg/dL (H)). Potassium (mmol/L)  Date Value  02/22/2024 2.9 (L)  07/12/2013 4.0   Magnesium (mg/dL)  Date Value  78/29/5621 3.2 (H)   Calcium (mg/dL)  Date Value  30/86/5784 7.2 (L)   Calcium, Total (mg/dL)  Date Value  69/62/9528 8.5   Albumin (g/dL)  Date Value  41/32/4401 2.6 (L)  07/12/2013 3.6   Phosphorus (mg/dL)  Date Value  02/72/5366 11.8 (H)   Sodium (mmol/L)  Date Value  02/22/2024 132 (L)  07/12/2013 135 (L)    Assessment  George Santos is a 68 y.o. male presenting with altered mental status. PMH significant for PVD, . Pharmacy has been consulted to monitor and replace electrolytes.  Diet: NPO MIVF: sodium bicarbonate @ 150 mL/hr Pertinent medications: N/A  Goal of Therapy: Electrolytes WNL  Plan:  K 2.9: Kcl IV x 4, Kcl via tube x 1 Patient in AKI, will likely require dialysis, which will help correct electrolyte abnormalities(hypermagnesemia, hypophosphatemia, hyponatremia) Check BMP, Mg, Phos with AM labs  Thank you for allowing pharmacy to be a part of this patient's care.  Bettey Costa, PharmD Clinical Pharmacist 02/22/2024 1:54 PM

## 2024-02-22 NOTE — ED Notes (Signed)
 Gave Phenylephrine Push

## 2024-02-22 NOTE — IPAL (Signed)
  Interdisciplinary Goals of Care Family Meeting   Date carried out: 02/22/2024  Location of the meeting: Conference room  Member's involved: Nurse Practitioner, Chaplain, and Family Member or next of kin  Durable Power of Attorney or acting medical decision maker: Spouse,   Shirleen Schirmer  Discussion: We discussed goals of care for George Santos. We discussed current clinical status in detail including multi-organ failure with circulatory shock requiring vasopressor support. [name] remains unresponsive on mechanical ventilatory support. [name] shared that the patient said recently he was tired, and didn't think he could fight his cancer anymore. We discussed ACLS resuscitation and the family including [name] was in agreement that they would not want him to undergo any more pain/suffering if his heart stopped on it's own.  Code status:   Code Status: Limited: Do not attempt resuscitation (DNR) -DNR-LIMITED -Do Not Intubate/DNI    Disposition: Continue current acute care  Time spent for the meeting: 30 minutes    Betsey Holiday, AGACNP-BC Acute Care Nurse Practitioner [facility]   [phone] / [phone] Please see Amion for details.

## 2024-02-22 NOTE — ED Triage Notes (Addendum)
 To ER from home via EMS for report of lower abdominal/bladder pain. No void tonight. Also complains of weakness, legs gave out when patient was ambulating to stretcher. Patient with labored breathing, unable to obtain pulse ox due to patient's cool extremities. DVT diagnosis earlier this week and started on Eliquis.

## 2024-02-22 NOTE — TOC Progression Note (Signed)
 Transition of Care [facility]) - Progression Note    Patient Details  Name: George Santos MRN: [id number] Date of Birth: 08-07-1956  Transition of Care [facility]) CM/SW Contact  Garret Reddish, RN Phone Number: 02/22/2024, 3:21 PM  Clinical Narrative:    Chart reviewed. Noted that patient was admitted Septic Shock due to aspiration pneumonia.  ICU team had family meeting with patient's family and they have decided to make patient comfort care. Family will let ICU team know when all family members have had the opportunity to visit and then they will with draw care and transition to comfort care.   TOC will continue to follow.        Expected Discharge Plan and Services                                               Social Determinants of Health (SDOH) Interventions SDOH Screenings   Food Insecurity: No Food Insecurity (02/18/2024)  Housing: Low Risk  (02/18/2024)  Transportation Needs: No Transportation Needs (02/18/2024)  Utilities: Not At Risk (02/18/2024)  Financial Resource Strain: Medium Risk (12/20/2023)   Received from [facility]  Social Connections: Moderately Isolated (02/18/2024)  Tobacco Use: High Risk (02/22/2024)  Health Literacy: Low Risk  (12/20/2023)   Received from [facility]    Readmission Risk Interventions     No data to display

## 2024-02-22 NOTE — ED Notes (Signed)
 Abx ordered, plan to give those post central line placement d/t limited access at this time during emergency

## 2024-02-22 NOTE — ED Notes (Signed)
 Charge RN made aware of pt. Pt moved from flex to ED9 with MD and another RN. RT called.

## 2024-02-22 NOTE — ED Notes (Addendum)
 Ward MD and Fleet Contras RN at bedside with this RN. Pt's family at bedside report that he was looking over to the right side and not responding to them talking to him. Still unable to obtain SpO2.  Pt appears to be seizing, extremities tense. 2mg  ativan IV given by Farley Ly as well as 1L NS bolus started on pressure bag at this time per verbal order from Ward MD.

## 2024-02-22 NOTE — H&P (Signed)
 NAME:  George Santos, MRN:  [id number], DOB:  03-03-1956, LOS: 0 ADMISSION DATE:  02/22/2024, CONSULTATION DATE:  02/22/24 REFERRING MD:  Dr. Elesa Massed, CHIEF COMPLAINT: Urinary retention & weakness   History of Present Illness:  68 yo M presenting to [facility] ED from home via EMS for evaluation of lower abdominal pain/bladder pain and weakness.   History obtained per chart review and family bedside report. Patient has been struggling with chronic diarrhea after starting radiation and chemotherapy for his stage III rectal cancer. He missed his last session due to a recent hospitalization. The patient was admitted from 02/18/24 - 02/20/24 with acute limb ischemia s/p thrombectomy & stent placement, discharged with Eliquis prescription.  After discharge the patient complained of generalized weakness, poor PO intake, generalized abdominal pain, nausea/vomiting. Patient also noticed some mild numbness in the right side of his face but no other focal neurological deficits.  ED course: Upon arrival patient was responsive but quickly became cold, mottled, unresponsive with fixed and dilated pupils. He was hypothermic, tachypneic, bradycardic and hypotensive. He was emergently intubated for airway protection. Concern for herniation vs head bleed, seizures, sepsis or dissection. Labs significant for severe lactic acidosis, leukocytosis, severe AGMA, AKI, mild hyponatremia, low chloride, mildly elevated AST, hypoalbuminemia, elevated troponin & severely elevated INR. Imaging of CT head stable, CT chest/abdomen/pelvis significant for suspected aspiration, dilated bowel with concern for illeus vs SBO in the setting of chronic mesenteric ischemia & possible pyelonephritis. Discussed with general surgery on call, findings thought to be more indicative of massive illeus, recommending decompression and monitoring for now.  Medications given: ketamine & rocuronium, ativan, mannitol, sodium bicarbonate 150 meq, levophed/  phenylephrine, cefepime/ flagyl/ vancomycin, 3 L IVF bolus, keppra, IV contrast Initial Vitals: 91.8, 22, 50, 73/54 & 37% 4 L [place] Significant labs: (Labs/ Imaging personally reviewed) I, Cheryll Cockayne Rust-Chester, AGACNP-BC, personally viewed and interpreted this ECG. EKG Interpretation: Date: 02/22/24, EKG Time: 03:45, Rate: 109, Rhythm: ST, QRS Axis: borderline LAD, Intervals: none, ST/T Wave abnormalities: none, Narrative Interpretation: ST Chemistry: Na+: 133, K+: 3.8, Cl: 88, BUN/Cr.: 51/ pending, Serum CO2/ AG: 11/34, albumin: 2.6, AST: 48 Hematology: WBC: 12.4, Hgb: 10.1,  Troponin: 144, Lactic/ PCT: >9/ pending PT: 84.5 INR: >10.0 ABG: <6.95/ 44/ 100/7.5  CXR 02/22/24: increased markings in the lung bases could reflect atelectasis vs infiltrates CT head wo contrast 02/22/24: no acute intracranial abnormality. CT angio chest/ abdomen/ pelvis for dissection 02/22/24:  Possible wall thickening about multiple mesenteric, renal, and iliac artery branches causing areas of moderate and severe narrowing. This is suggestive of a medium/small vessel vasculitis. Marked dilation of the small bowel with possible transition point in the low anterior abdomen. The small bowel downstream from this is mildly distended with fluid. Question mild wall thickening of the small bowel loops in the pelvis. Differential considerations include ileus secondary to chronic mesenteric ischemia versus enteritis versus mechanical obstruction. Geographic hypoattenuation within the posterior left kidney suspicious for infarct. Primary differential consideration is pyelonephritis. Bronchial wall thickening and mucous plugging in the lower lobes. Likely secondary to aspiration given fluid column extending above the thoracic inlet.  PCCM consulted for admission due to severe metabolic acidosis suspect secondary to acute renal failure, acute hypoxic respiratory failure s/t acute encephalopathy & sepsis s/t aspiration vs intra-abdominal  source requiring mechanical ventilatory support.  Pertinent  Medical History  PVD Rectal Cancer Satge III HTN HLD CAD Tobacco Abuse  Significant Hospital Events: Including procedures, antibiotic start and stop dates in addition to other  pertinent events   02/22/24: Admit to ICU with severe metabolic acidosis suspect secondary to acute renal failure, acute hypoxic respiratory failure s/t acute encephalopathy & sepsis s/t aspiration vs intra-abdominal source requiring mechanical ventilatory support.  Interim History / Subjective:  Patient unresponsive, RASS: -5 Family updated extensively, plan of care discussed, all questions and concerns answered at this time.  Objective   Blood pressure (!) 81/63, pulse (!) 117, resp. rate (!) 37, height 5\' 9"  (1.753 m), weight 57 kg.    Vent Mode: PRVC FiO2 (%):  [70 %-100 %] 70 % Set Rate:  [18 bmp] 18 bmp Vt Set:  [450 mL] 450 mL PEEP:  [5 cmH20] 5 cmH20  No intake or output data in the 24 hours ending 02/22/24 0313 Filed Weights   02/22/24 0027  Weight: 57 kg    Examination: General: Adult male, critically ill, lying in bed intubated & sedated requiring mechanical ventilation, NAD HEENT: MM pink/moist, anicteric, atraumatic, neck supple Neuro: RASS: -5, unable to follow commands, Pupils +3 and sluggish, no cough/no gag/ no corneal reflexes CV: s1s2 RRR, ST on monitor, no r/m/g Pulm: Regular, non labored on PRVC  60% & PEEP 5 breath sounds rhonchi-BUL & diminished-BLL GI: soft, rounded, bs x 4 GU: foley in place with clear yellow urine Skin: unstageable DTI on sacrum, sacral skin tear and excoriation, scattered ecchymosis Extremities: warm/dry, pulses + 2 R/P, no edema noted  Resolved Hospital Problem list     Assessment & Plan:  Suspected Sepsis secondary to suspected Aspiration vs intra-abdominal source Initial interventions/workup included: 3 L of NS/LR & Cefepime/ Vancomycin/ Metronidazole - f/u cultures, trend lactic/ PCT -  Daily CBC, monitor WBC/ fever curve - IV antibiotics: zosyn & vancomycin  - IVF hydration as needed - Continue vasopressors to maintain MAP< 65: norepinephrine / vasopressin  Suspected Illeus vs SBO Discussed imaging with general surgery, considered more consistent with illeus. - general surgery consulted, appreciate input - OGT to ILWS - NPO - Zosyn and vancomycin as above  Acute Hypoxic / Hypercapnic Respiratory Failure secondary to acute encephalopathy and suspected aspiration - Ventilator settings: PRVC  8 mL/kg, 60% FiO2, 5 PEEP, continue ventilator support & lung protective strategies - Wean PEEP & FiO2 as tolerated, maintain SpO2 > 90% - Head of bed elevated 30 degrees, VAP protocol in place - Plateau pressures less than 30 cm H20  - Intermittent chest x-ray & ABG PRN - Daily WUA with SBT as tolerated  - Ensure adequate pulmonary hygiene  - F/u cultures, trend PCT - Aspiration Pna coverage zosyn - Bronchodilators PRN - PAD protocol in place: continue Fentanyl IVP & Versed IVP  Acute Renal Failure Severe High Anion Gap Metabolic Acidosis  Baseline Cr: 0.87, Cr on admission:4.28 - Strict I/O's: alert provider if UOP < 0.5 mL/kg/hr - sodium bicarbonate infusion  - Daily BMP, replace electrolytes PRN - Avoid nephrotoxic agents as able, ensure adequate renal perfusion - Consult nephrology if iHD or CRRT indicated   Elevated Troponin secondary to N-STEMI vs demand ischemia - Trend troponin - Echocardiogram ordered - hold heparin drip due to supra-therapeutic INR - continuous cardiac monitoring - Consider Cardiology consultation depending on work up above  Supra therapeutic INR - hold Eliquis - f/u INR  Acute Encephalopathy secondary to metabolic vs seizure activity  UDS negative - f/u EEG - consider neurology consultation PRN - frequent neuro checks - avoid sedating medications as tolerated - consider f/u MRI brain  Mild Transaminitis  - Trend hepatic  function -  Consider RUQ [place] - avoid hepatotoxic agents  Best Practice (right click and "Reselect all SmartList Selections" daily)  Diet/type: NPO DVT prophylaxis SCD Pressure ulcer(s): present on admission  GI prophylaxis: PPI Lines: Central line and yes and it is still needed Foley:  Yes, and it is still needed Code Status:  DNR Last date of multidisciplinary goals of care discussion [02/22/24]  Labs   CBC: Recent Labs  Lab 02/18/24 0947 02/19/24 0417 02/20/24 0416  WBC 4.9 3.3* 3.5*  NEUTROABS 3.8  --   --   HGB 12.4* 10.5* 10.6*  HCT 39.7 33.3* 32.7*  MCV 79.9* 78.4* 76.6*  PLT 230 242 259    Basic Metabolic Panel: Recent Labs  Lab 02/18/24 0947 02/19/24 0417 02/20/24 0416 02/22/24 0115  NA 133* 131* 130* 133*  K 3.7 3.7 3.4* 3.8  CL 100 102 101 88*  CO2 19* 21* 19* 11*  GLUCOSE 179* 133* 150* 158*  BUN 23 17 19  51*  CREATININE 1.06 0.96 0.87 PENDING  CALCIUM 8.4* 7.9* 8.1* 9.2   GFR: CrCl cannot be calculated (This lab value cannot be used to calculate CrCl because it is not a number: PENDING). Recent Labs  Lab 02/18/24 0947 02/19/24 0417 02/20/24 0416 02/22/24 0115  WBC 4.9 3.3* 3.5*  --   LATICACIDVEN  --   --   --  >9.0*    Liver Function Tests: Recent Labs  Lab 02/18/24 0947 02/19/24 0417 02/22/24 0115  AST 18 71* 48*  ALT 15 23 29   ALKPHOS 41 31* 52  BILITOT 0.8 0.6 0.7  PROT 6.5 5.5* 6.6  ALBUMIN 3.4* 2.9* 2.6*   No results for input(s): "LIPASE", "AMYLASE" in the last 168 hours. No results for input(s): "AMMONIA" in the last 168 hours.  ABG    Component Value Date/Time   PHART <6.95 (LL) 02/22/2024 0111   PCO2ART 44 02/22/2024 0111   PO2ART 100 02/22/2024 0111   HCO3 7.5 (L) 02/22/2024 0111   ACIDBASEDEF 26.3 (H) 02/22/2024 0111   O2SAT 94.9 02/22/2024 0111     Coagulation Profile: Recent Labs  Lab 02/18/24 0947 02/22/24 0115  INR 1.3* >10.0*    Cardiac Enzymes: No results for input(s): "CKTOTAL", "CKMB",  "CKMBINDEX", "TROPONINI" in the last 168 hours.  HbA1C: No results found for: "HGBA1C"  CBG: Recent Labs  Lab 02/22/24 0103  GLUCAP 75    Review of Systems:   UTA patient intubated and sedated.  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   Surgical History:   Past Surgical History:  Procedure Laterality Date   CARDIAC CATHETERIZATION  09/04/2011   CORONARY ARTERY BYPASS GRAFT N/A 09/07/2011   Procedure: CORONARY ARTERY BYPASS GRAFT (1v mini left anterior thoracotomy approach; LIMA-LAD); Location: [facility]; Surgeon: Clent Jacks, MD   ENDOVASCULAR REPAIR/STENT GRAFT N/A 04/26/2022   Procedure: ENDOVASCULAR REPAIR/STENT GRAFT;  Surgeon: Annice Needy, MD;  Location: [facility] INVASIVE CV LAB;  Service: Cardiovascular;  Laterality: N/A;   HIATAL HERNIA REPAIR     LOWER EXTREMITY ANGIOGRAPHY Right 02/20/2022   Procedure: Lower Extremity Angiography;  Surgeon: Annice Needy, MD;  Location: [facility] INVASIVE CV LAB;  Service: Cardiovascular;  Laterality: Right;   LOWER EXTREMITY ANGIOGRAPHY Right 02/18/2024   Procedure: Lower Extremity Angiography;  Surgeon:  Annice Needy, MD;  Location: [facility] INVASIVE CV LAB;  Service: Cardiovascular;  Laterality: Right;     Social History:   reports that he has been smoking cigarettes. He has a 22.5 pack-year smoking history. He has never used smokeless tobacco. He reports that he does not currently use alcohol. He reports that he does not use drugs.   Family History:  His family history is not on file.   Allergies No Known Allergies   Home Medications  Prior to Admission medications   Medication Sig Start Date End Date Taking?  Authorizing Provider  acetaminophen (TYLENOL) 325 MG tablet Take 650 mg by mouth every 6 (six) hours as needed.    [provider]  APIXABAN Everlene Balls) VTE STARTER PACK (10MG  AND 5MG ) Take as directed on package: start with two-5mg  tablets twice daily for 7 days. On day 8, switch to one-5mg  tablet twice daily. 02/19/24   Alford Highland, MD  aspirin EC 81 MG tablet Take 81 mg by mouth daily. Swallow whole.    [provider]  atorvastatin (LIPITOR) 10 MG tablet Take 1 tablet (10 mg total) by mouth daily. 02/21/24 02/20/25  Annice Needy, MD  capecitabine (XELODA) 500 MG tablet Take by mouth.  Take 3 tablets (1,500 mg total) by mouth every twelve (12) hours . Only take on radiation treatment days (Monday - Friday). Take morning dose before each radiation treatment. Take with full glass of water after a meal, within 30 minutes 01/11/24   [provider]  diphenoxylate-atropine (LOMOTIL) 2.5-0.025 MG tablet Take 1 tablet by mouth 3 (three) times daily as needed for diarrhea or loose stools. 02/19/24   Alford Highland, MD  oxyCODONE (OXY IR/ROXICODONE) 5 MG immediate release tablet Take 10 mg by mouth every 6 (six) hours as needed.  Take 2 tablets (10 mg total) by mouth every six (6) hours as needed for pain for up to 28 days. 02/13/24 03/12/24  [provider]  oxyCODONE-acetaminophen (PERCOCET/ROXICET) 5-325 MG tablet Take 1-2 tablets by mouth every 4 (four) hours as needed for moderate pain. 04/27/22   Georgiana Spinner, NP     Critical care time: 68 minutes      Betsey Holiday, AGACNP-BC Acute Care Nurse Practitioner [facility]   [phone] / [phone] Please see Amion for details.

## 2024-02-22 NOTE — ED Notes (Addendum)
 0127 57mg  ketamine IV given per verbal order from Ward MD 0128 57mg  roc given IV per verbal order from Ward MD 0128 phenylepherine IV given per verbal order from Ward MD. Pt's BP 51/41 0129 7.5 ETT 24 @ teeth placed by Ward MD. Placement confirmed via positive color change and bilat breath sounds. XR called

## 2024-02-22 NOTE — Consult Note (Signed)
 Consultation Note Date: 02/22/2024   Patient Name: George Santos  DOB: August 30, 1956  MRN: [id number]  Age / Sex: 68 y.o., male  PCP: Lorretta Harp, MD Referring Physician: Vida Rigger, MD  Reason for Consultation: Establishing goals of care   HPI/Brief Hospital Course: 68 y.o. male  with past medical history of stage III rectal cancer being followed by [facility] oncology currently undergoing radiation/chemotherapy treatments, COPD, PVD, HTN, HLD and CAD s/p CABG x1 admitted from home on 02/22/2024 with lower abdominal pain/bladder pain and weakness.  Noted to have been struggling with chronic diarrhea since undergoing cancer treatments  Noted last hospitalization 3/17-3/19 for acute limb ischemia, s/p thrombectomy and stent placement  Required emergent intubation in ED due to unresponsiveness, hypothermic, bradycardic, tachypneic, bradycardic and hypotensive Found to have severe lactic acidosis, leukocytosis, significant AKI and severely elevated INR   Palliative medicine was consulted for assisting with goals of care conversations  Subjective:  Extensive chart review has been completed prior to meeting patient including labs, vital signs, imaging, progress notes, orders, and available advanced directive documents from current and previous encounters.  Requested by CCM team to join collaborative family meeting.  Met with George Modena, NP with CCM and multiple family members in chapel. In attendance were wife, father, siblings, biological children, step children and other family members.  Introduced myself as a Publishing rights manager as a member of the palliative care team. Explained palliative medicine is specialized medical care for people living with serious illness. It focuses on providing relief from the symptoms and stress of a serious illness. The goal is to improve quality of life for both the patient and the family.   Medical updates provided by George Modena,  NP.  Wife and other family members share that over the last several months, George Santos has had a decline. On a good day, they share he is full of laughter and enjoys playing video games. They share since starting cancer treatments he has been struggling and declining.  We discussed patient's current illness and what it means in the larger context of patient's on-going co-morbidities. Natural disease trajectory and expectations at EOL were discussed.   The difference between aggressive medical intervention and comfort care was discussed. Also discussed treating the treatable and monitoring over next 24-48 hours versus transitioning to comfort care. Wife and other family members share that George Santos would never want to be on ventilator and would never be accepting of dialysis. They explain he never wanted to suffer.  We discussed comfort care, George Santos would no longer receive aggressive medical interventions such as continuous vital signs, lab work, radiology testing, or medications not focused on comfort. He would be liberated from mechanical ventilation. All care would focus on how the patient is looking and feeling. This would include management of any symptoms that may cause discomfort, pain, shortness of breath, cough, nausea, agitation, anxiety, and/or secretions etc. Symptoms would be managed with medications and other non-pharmacological interventions such as spiritual support if requested, repositioning, music therapy, or therapeutic listening. Family verbalized understanding and appreciation.    Family would like to pursue transition to comfort care but allowing time for family to visit. To reach out to [facility] team once they are ready to transition.  All questions/concerns addressed. Emotional support provided to patient/family/support persons. PMT will continue to follow and support patient as needed.  Objective: Primary Diagnoses: Present on Admission:  High anion gap metabolic  acidosis   Vital Signs: BP 108/61   Pulse 94   Temp Marland Kitchen)  100.8 F (38.2 C)   Resp (!) 26   Ht 5\' 9"  (1.753 m)   Wt 55.7 kg   SpO2 100%   BMI 18.13 kg/m  Pain Scale: CPOT   Pain Score: Asleep   IO: Intake/output summary:  Intake/Output Summary (Last 24 hours) at 02/22/2024 1601 Last data filed at 02/22/2024 1550 Gross per 24 hour  Intake 6368.32 ml  Output 1750 ml  Net 4618.32 ml    LBM:   Baseline Weight: Weight: 57 kg Most recent weight: Weight: 55.7 kg       Assessment and Plan  SUMMARY OF RECOMMENDATIONS   Transition to CMO by CCM team once family has had a chance to visit  Palliative Prophylaxis:   Bowel Regimen, Delirium Protocol and Frequent Pain Assessment  Spiritual:  Desire for ongoing Chaplain support: No Education provided on Chaplain services offered through PMT, education provided   Thank you for this consult and allowing Palliative Medicine to participate in the care of George Santos. Palliative medicine will continue to follow and assist as needed.   Time Total: 55 minutes  Time spent includes: Detailed review of medical records (labs, imaging, vital signs), medically appropriate exam (mental status, respiratory, cardiac, skin), discussed with treatment team, counseling and educating patient, family and staff, documenting clinical information, medication management and coordination of care.   Signed by: Leeanne Deed, DNP, AGNP-C Palliative Medicine    Please contact Palliative Medicine Team phone at [phone] for questions and concerns.  For individual provider: See Loretha Stapler

## 2024-02-22 NOTE — Sepsis Progress Note (Signed)
 Elink monitoring for the code sepsis protocol.

## 2024-02-27 LAB — CULTURE, BLOOD (ROUTINE X 2)
Culture: NO GROWTH
Culture: NO GROWTH
Special Requests: ADEQUATE

## 2024-03-04 NOTE — Death Summary Note (Signed)
 DEATH SUMMARY   Patient Details  Name: George Santos MRN: [id number] DOB: [birth year]  Admission/Discharge Information   Admit Date:  03/03/24  Date of Death: Date of Death: 03-04-24  Time of Death: Time of Death: February 14, 2024  Length of Stay: 1  Referring Physician: Lorretta Harp, MD   Reason(s) for Hospitalization  Altered mental status  Diagnoses  Preliminary cause of death: severe sepsis with shock and multiorgan failure Secondary Diagnoses (including complications and co-morbidities):  Principal Problem:   High anion gap metabolic acidosis Active Problems:   SBO (small bowel obstruction) [facility])   Brief Hospital Course (including significant findings, care, treatment, and services provided and events leading to death)  George Santos is a 68 y.o. year old male who was newly diagnosed rectal cancer on chemotherapy/radiation ([facility]), hx of coronary artery disease status post CABG x 1V, hypertension, hyperlipidemia, peripheral arterial disease on Eliquis who presented to [facility] ED on 2024-03-03 for altered mental status. Patient was recently admitted from 03/17-03/19 secondary to limb ischemia requiring vascular surgery intervention. In the ED, he was found to be hypoxic, hypotensive and unresponsive. Initially ABG showed arterial pH <6.95. He was emergently intubated. Additional labs showed ARF with sCr - 4.28, venous lactate >9.0 x2, INR >10, Bcx negative, high sensitivity troponin 114 --> 234 --> 290 --> 691. He did have CT Head which was without acute finding. CTA Chest/Abdomen/Pelvis did show distension of stomach, small bowel, and proximal colon. Also concern for possible aspiration. He was admitted to the ICU on multiple pressor for septic shock with multiorgan failure. General surgery was consulted for evaluation of possible ileus vs sbo.Due to worsening shock with multiorgan failure and poor prognosis, palliative was consulted to discuss goals of care.Family reported that he would NEVER WANT to be  on the ventilator, would NEVER WANT dialysis, and they feel like he is suffering, and they do not want him to suffer any longer.   The family was in agreement and requested that patient be TRANSITION TO COMFORT MEASURES once all family has had time to visit.  Patient was transitioned to comfort care and terminally extubated on 03/03/2024 @4 :40 he passed away shortly after midnight on 2024-03-04@ 0003   Pertinent Labs and Studies  Significant Diagnostic Studies CT Angio Chest/Abd/Pel for Dissection W and/or Wo Contrast Result Date: 2024-03-03 CLINICAL DATA:  Acute aortic syndrome suspected. Lower abdominal and bladder pain. Unable to void tonight. Weakness in the legs. Labored breathing. DVT diagnosis are or this week started on Eliquis. EXAM: CT ANGIOGRAPHY CHEST, ABDOMEN AND PELVIS TECHNIQUE: Non-contrast CT of the chest was initially obtained. Multidetector CT imaging through the chest, abdomen and pelvis was performed using the standard protocol during bolus administration of intravenous contrast. Multiplanar reconstructed images and MIPs were obtained and reviewed to evaluate the vascular anatomy. RADIATION DOSE REDUCTION: This exam was performed according to the departmental dose-optimization program which includes automated exposure control, adjustment of the mA and/or kV according to patient size and/or use of iterative reconstruction technique. CONTRAST:  OMNIPAQUE IOHEXOL 350 MG/ML SOLN COMPARISON:  Same day radiographs and CT 04/06/2022 FINDINGS: CTA CHEST FINDINGS Cardiovascular: No intramural hematoma, penetrating atherosclerotic ulcer, aneurysm or dissection in the thoracic aorta. Irregular predominantly noncalcified atherosclerotic plaque throughout the aorta. No pericardial effusion. Mediastinum/Nodes: Endotracheal tube tip in the intrathoracic trachea. Enteric tube tip in the stomach. Fluid column throughout the esophagus reaching above the thoracic inlet. No lymphadenopathy. Lungs/Pleura:  Emphysema. Bronchial wall thickening and mucous plugging in the lower lobes. No focal  consolidation, pleural effusion, or pneumothorax. Musculoskeletal: No acute fracture. Review of the MIP images confirms the above findings. CTA ABDOMEN AND PELVIS FINDINGS VASCULAR Aorta: Aorto bi-iliac endovascular stent is patent. No aneurysm or dissection. Irregular atherosclerotic plaque in the abdominal aorta. Celiac: Patent without aneurysm or dissection. Question wall thickening about the celiac axis and its branch arteries with moderate to severe narrowing of the branch arteries. SMA: Patent without aneurysm or dissection. Question wall thickening about the distal branches of the SMA. Renals: Patent without aneurysm or dissection. Question wall thickening about the distal renal arteries bilaterally and causing moderate to severe narrowing. IMA: Occluded. Inflow: Moderate narrowing in the left common iliac artery stent. Severe narrowing in the left internal and external iliac arteries and left common femoral artery. Question wall thickening about the left external iliac artery. Severe narrowing in the right external iliac artery distal to the stent. Patent common femoral artery stent on the right. Veins: No obvious venous abnormality within the limitations of this arterial phase study. Review of the MIP images confirms the above findings. NON-VASCULAR Hepatobiliary: No acute abnormality. Pancreas: Unremarkable. Spleen: Unremarkable. Adrenals/Urinary Tract: Normal adrenal glands. Geographic hypoattenuation within the posterior left kidney and medial right kidney suspicious for infarcts. Primary differential consideration is pyelonephritis. No urinary calculi or hydronephrosis. Unremarkable bladder. Stomach/Bowel: Distended stomach. Marked dilation of the small bowel. There may be a transition point in the low anterior abdomen (series 8/image 45) however the small bowel downstream from this is mildly distended with fluid.  Question mild wall thickening of the small bowel loops in the pelvis. There is fluid in the right colon. Normal caliber colon without wall thickening. Lymphatic: No lymphadenopathy. Reproductive: No acute abnormality. Small amount of free fluid in the pelvis. No free intraperitoneal air. Free intraperitoneal fluid or air. Musculoskeletal: No acute fracture. Review of the MIP images confirms the above findings. IMPRESSION: 1. Possible wall thickening about multiple mesenteric, renal, and iliac artery branches causing areas of moderate and severe narrowing. This is suggestive of a medium/small vessel vasculitis. 2. Marked dilation of the small bowel with possible transition point in the low anterior abdomen. The small bowel downstream from this is mildly distended with fluid. Question mild wall thickening of the small bowel loops in the pelvis. Differential considerations include ileus secondary to chronic mesenteric ischemia versus enteritis versus mechanical obstruction 3. Geographic hypoattenuation within the posterior left kidney suspicious for infarct. Primary differential consideration is pyelonephritis. 4. Bronchial wall thickening and mucous plugging in the lower lobes. Likely secondary to aspiration given fluid column extending above the thoracic inlet. Electronically Signed   By: Minerva Fester M.D.   On: 02/22/2024 03:25   CT HEAD WO CONTRAST ( ) Result Date: 02/22/2024 CLINICAL DATA:  Mental status change, unknown cause EXAM: CT HEAD WITHOUT CONTRAST TECHNIQUE: Contiguous axial images were obtained from the base of the skull through the vertex without intravenous contrast. RADIATION DOSE REDUCTION: This exam was performed according to the departmental dose-optimization program which includes automated exposure control, adjustment of the mA and/or kV according to patient size and/or use of iterative reconstruction technique. COMPARISON:  CT head 09/01/2011 FINDINGS: Brain: No intracranial hemorrhage,  mass effect, or evidence of acute infarct. No hydrocephalus. No extra-axial fluid collection. Vascular: No hyperdense vessel or unexpected calcification. Skull: No fracture or focal lesion. Sinuses/Orbits: Mucosal thickening in the right maxillary sinus with air-fluid level. The paranasal sinuses and mastoid air cells are otherwise well aerated. Other: None. IMPRESSION: 1. No acute intracranial abnormality. 2. Right mucosal  thickening and air-fluid level. Correlate for acute sinusitis. Electronically Signed   By: Minerva Fester M.D.   On: 02/22/2024 03:02   DG Abd Portable 1 View Result Date: 02/22/2024 CLINICAL DATA:  OG tube placement EXAM: PORTABLE ABDOMEN - 1 VIEW COMPARISON:  None Available. FINDINGS: Enteric tube side port in the stomach and tip near the pylorus duodenal junction. Right femoral catheter. Dilated loops of small bowel in the central abdomen. Endovascular aorto bi-iliac stent. IMPRESSION: Enteric tube side port in the stomach and tip near the pylorus duodenal junction. Dilated loops of small bowel in the central abdomen. Correlate with CT for obstruction versus ileus. Electronically Signed   By: Minerva Fester M.D.   On: 02/22/2024 02:45   DG Chest Portable 1 View Result Date: 02/22/2024 CLINICAL DATA:  Check ETT placement post intubation EXAM: PORTABLE CHEST 1 VIEW COMPARISON:  02/22/2024 at 12:43 a.m. FINDINGS: Endotracheal tube tip in the intrathoracic trachea 3.7 cm from the carina. Subdiaphragmatic enteric tube with tip near the pylorus duodenal junction. Stable cardiomediastinal silhouette. No focal consolidation, pleural effusion, or pneumothorax. No displaced rib fractures. IMPRESSION: Endotracheal tube tip in the intrathoracic trachea 3.7 cm from the carina. Electronically Signed   By: Minerva Fester M.D.   On: 02/22/2024 02:42   DG Chest Port 1 View Result Date: 02/22/2024 CLINICAL DATA:  Questionable sepsis - evaluate for abnormality EXAM: PORTABLE CHEST 1 VIEW COMPARISON:   09/01/2011 FINDINGS: Heart and mediastinal contours within normal limits. Increased markings in the lung bases. No effusions. No acute bony abnormality. IMPRESSION: Increased markings in the lung bases could reflect atelectasis or early infiltrates. Electronically Signed   By: Charlett Nose M.D.   On: 02/22/2024 00:54   PERIPHERAL VASCULAR CATHETERIZATION Result Date: 02/18/2024 See surgical note for result.   Microbiology Recent Results (from the past 240 hours)  C Difficile Quick Screen w PCR reflex     Status: Abnormal   Collection Time: 02/19/24 12:56 PM   Specimen: STOOL  Result Value Ref Range Status   C Diff antigen NEGATIVE NEGATIVE Final   C Diff toxin POSITIVE (A) NEGATIVE Final   C Diff interpretation Results are indeterminate. See PCR results.  Final    Comment: Performed at [facility], [address] [place], [place] [address]  C. Diff by PCR, Reflexed     Status: None   Collection Time: 02/19/24 12:56 PM  Result Value Ref Range Status   Toxigenic C. Difficile by PCR NEGATIVE NEGATIVE Final    Comment: Patient is colonized with non toxigenic C. difficile. May not need treatment unless significant symptoms are present. Performed at [facility], [address] [place], [place] [address]   Gastrointestinal Panel by PCR , Stool     Status: None   Collection Time: 02/19/24 12:56 PM  Result Value Ref Range Status   Campylobacter species NOT DETECTED NOT DETECTED Final   Plesimonas shigelloides NOT DETECTED NOT DETECTED Final   Salmonella species NOT DETECTED NOT DETECTED Final   Yersinia enterocolitica NOT DETECTED NOT DETECTED Final   Vibrio species NOT DETECTED NOT DETECTED Final   Vibrio cholerae NOT DETECTED NOT DETECTED Final   Enteroaggregative E coli (EAEC) NOT DETECTED NOT DETECTED Final   Enteropathogenic E coli (EPEC) NOT DETECTED NOT DETECTED Final   Enterotoxigenic E coli (ETEC) NOT DETECTED NOT DETECTED Final   Shiga like toxin producing  E coli (STEC) NOT DETECTED NOT DETECTED Final   Shigella/Enteroinvasive E coli (EIEC) NOT DETECTED NOT DETECTED Final  Cryptosporidium NOT DETECTED NOT DETECTED Final   Cyclospora cayetanensis NOT DETECTED NOT DETECTED Final   Entamoeba histolytica NOT DETECTED NOT DETECTED Final   Giardia lamblia NOT DETECTED NOT DETECTED Final   Adenovirus F40/41 NOT DETECTED NOT DETECTED Final   Astrovirus NOT DETECTED NOT DETECTED Final   Norovirus GI/GII NOT DETECTED NOT DETECTED Final   Rotavirus A NOT DETECTED NOT DETECTED Final   Sapovirus (I, II, IV, and V) NOT DETECTED NOT DETECTED Final    Comment: Performed at [facility], [address] [place], [place] [address]  Blood culture (routine x 2)     Status: None (Preliminary result)   Collection Time: 02/22/24  1:15 AM   Specimen: BLOOD  Result Value Ref Range Status   Specimen Description BLOOD BLOOD LEFT ARM  Final   Special Requests   Final    BOTTLES DRAWN AEROBIC AND ANAEROBIC Blood Culture results may not be optimal due to an inadequate volume of blood received in culture bottles   Culture   Final    NO GROWTH 1 DAY Performed at [facility], [address], [place], [place] [address]    Report Status PENDING  Incomplete  Resp panel by RT-PCR (RSV, Flu A&B, Covid) Anterior Nasal Swab     Status: None   Collection Time: 02/22/24  3:59 AM   Specimen: Anterior Nasal Swab  Result Value Ref Range Status   SARS Coronavirus 2 by RT PCR NEGATIVE NEGATIVE Final    Comment: (NOTE) SARS-CoV-2 target nucleic acids are NOT DETECTED.  The SARS-CoV-2 RNA is generally detectable in upper respiratory specimens during the acute phase of infection. The lowest concentration of SARS-CoV-2 viral copies this assay can detect is 138 copies/mL. A negative result does not preclude SARS-Cov-2 infection and should not be used as the sole basis for treatment or other patient management decisions. A negative result may occur with   improper specimen collection/handling, submission of specimen other than nasopharyngeal swab, presence of viral mutation(s) within the areas targeted by this assay, and inadequate number of viral copies(<138 copies/mL). A negative result must be combined with clinical observations, patient history, and epidemiological information. The expected result is Negative.  Fact Sheet for Patients:  BloggerCourse.com  Fact Sheet for Healthcare Providers:  SeriousBroker.it  This test is no t yet approved or cleared by the [place] FDA and  has been authorized for detection and/or diagnosis of SARS-CoV-2 by FDA under an Emergency Use Authorization (EUA). This EUA will remain  in effect (meaning this test can be used) for the duration of the COVID-19 declaration under Section 564(b)(1) of the Act, 21 U.S.C.section 360bbb-3(b)(1), unless the authorization is terminated  or revoked sooner.       Influenza A by PCR NEGATIVE NEGATIVE Final   Influenza B by PCR NEGATIVE NEGATIVE Final    Comment: (NOTE) The Xpert Xpress SARS-CoV-2/FLU/RSV plus assay is intended as an aid in the diagnosis of influenza from Nasopharyngeal swab specimens and should not be used as a sole basis for treatment. Nasal washings and aspirates are unacceptable for Xpert Xpress SARS-CoV-2/FLU/RSV testing.  Fact Sheet for Patients: BloggerCourse.com  Fact Sheet for Healthcare Providers: SeriousBroker.it  This test is not yet approved or cleared by the [place] FDA and has been authorized for detection and/or diagnosis of SARS-CoV-2 by FDA under an Emergency Use Authorization (EUA). This EUA will remain in effect (meaning this test can be used) for the duration of the COVID-19 declaration under Section 564(b)(1) of  the Act, 21 U.S.C. section 360bbb-3(b)(1), unless the authorization is terminated or revoked.      Resp Syncytial Virus by PCR NEGATIVE NEGATIVE Final    Comment: (NOTE) Fact Sheet for Patients: BloggerCourse.com  Fact Sheet for Healthcare Providers: SeriousBroker.it  This test is not yet approved or cleared by the [place] FDA and has been authorized for detection and/or diagnosis of SARS-CoV-2 by FDA under an Emergency Use Authorization (EUA). This EUA will remain in effect (meaning this test can be used) for the duration of the COVID-19 declaration under Section 564(b)(1) of the Act, 21 U.S.C. section 360bbb-3(b)(1), unless the authorization is terminated or revoked.  Performed at [facility], [address] [place], [place] [address]   MRSA Next Gen by PCR, Nasal     Status: None   Collection Time: 02/22/24  5:27 AM   Specimen: Nasal Mucosa; Nasal Swab  Result Value Ref Range Status   MRSA by PCR Next Gen NOT DETECTED NOT DETECTED Final    Comment: (NOTE) The GeneXpert MRSA Assay (FDA approved for NASAL specimens only), is one component of a comprehensive MRSA colonization surveillance program. It is not intended to diagnose MRSA infection nor to guide or monitor treatment for MRSA infections. Test performance is not FDA approved in patients less than 40 years old. Performed at [facility], [address] [place], [place] [address]   Culture, blood (Routine X 2) w Reflex to ID Panel     Status: None (Preliminary result)   Collection Time: 02/22/24 10:31 AM   Specimen: BLOOD  Result Value Ref Range Status   Specimen Description BLOOD BLOOD LEFT ARM AEROBIC BOTTLE ONLY  Final   Special Requests Blood Culture adequate volume  Final   Culture   Final    NO GROWTH < 24 HOURS Performed at [facility], [address] [place], [place] [address]    Report Status PENDING  Incomplete    Lab Basic Metabolic Panel: Recent Labs  Lab 02/18/24 0947 02/19/24 0417 02/20/24 0416  02/22/24 0115 02/22/24 0359 02/22/24 1227  NA 133* 131* 130* 133*  --  132*  K 3.7 3.7 3.4* 3.8  --  2.9*  CL 100 102 101 88*  --  83*  CO2 19* 21* 19* 11*  --  30  GLUCOSE 179* 133* 150* 158*  --  152*  BUN 23 17 19  51*  --  59*  CREATININE 1.06 0.96 0.87 4.28*  --  3.38*  CALCIUM 8.4* 7.9* 8.1* 9.2  --  7.2*  MG  --   --   --   --  3.2*  --   PHOS  --   --   --   --  11.8*  --    Liver Function Tests: Recent Labs  Lab 02/18/24 0947 02/19/24 0417 02/22/24 0115  AST 18 71* 48*  ALT 15 23 29   ALKPHOS 41 31* 52  BILITOT 0.8 0.6 0.7  PROT 6.5 5.5* 6.6  ALBUMIN 3.4* 2.9* 2.6*   No results for input(s): "LIPASE", "AMYLASE" in the last 168 hours. Recent Labs  Lab 02/22/24 0845  AMMONIA 18   CBC: Recent Labs  Lab 02/18/24 0947 02/19/24 0417 02/20/24 0416 02/22/24 0411  WBC 4.9 3.3* 3.5* 12.4*  NEUTROABS 3.8  --   --  9.6*  HGB 12.4* 10.5* 10.6* 10.1*  HCT 39.7 33.3* 32.7* 32.5*  MCV 79.9* 78.4* 76.6* 81.9  PLT 230 242 259 299   Cardiac Enzymes: Recent Labs  Lab 02/22/24 0414  CKTOTAL 221   Sepsis Labs: Recent Labs  Lab 02/18/24 0947 02/19/24 0417 02/20/24 0416 02/22/24 0115 02/22/24 0359 02/22/24 0411 02/22/24 1031 02/22/24 1227  PROCALCITON  --   --   --   --  38.32  --   --   --   WBC 4.9 3.3* 3.5*  --   --  12.4*  --   --   LATICACIDVEN  --   --   --  >9.0* >9.0*  --  4.4* 3.2*    Procedures/Operations  See procedure notes   Loraine Leriche 02/11/2024, 7:17 AM

## 2024-03-04 NOTE — Progress Notes (Signed)
   02/22/24 1158  Spiritual Encounters  Type of Visit Initial  Care provided to: Family  Conversation partners present during encounter Nurse  Referral source Nurse (RN/NT/LPN)  Reason for visit Patient death  OnCall Visit Yes  Interventions  Spiritual Care Interventions Made Compassionate presence  Intervention Outcomes  Outcomes Connection to spiritual care  Spiritual Care Plan  Spiritual Care Issues Still Outstanding No further spiritual care needs at this time (see row info)

## 2024-03-04 DEATH — deceased
